# Patient Record
Sex: Female | Born: 1995 | Race: Black or African American | Hispanic: No | Marital: Single | Smoking: Never smoker
Health system: Southern US, Community
[De-identification: ages and names within clinical notes are randomized; demographics above are authoritative.]

---

## 2017-08-08 ENCOUNTER — Emergency Department (HOSPITAL_COMMUNITY): Payer: Self-pay

## 2017-08-08 ENCOUNTER — Emergency Department (HOSPITAL_COMMUNITY)
Admission: EM | Admit: 2017-08-08 | Discharge: 2017-08-09 | Disposition: A | Discharge status: Home / Self Care | Payer: Self-pay | Attending: Emergency Medicine | Admitting: Emergency Medicine

## 2017-08-08 ENCOUNTER — Other Ambulatory Visit: Payer: Self-pay

## 2017-08-08 DIAGNOSIS — R079 Chest pain, unspecified: Secondary | ICD-10-CM | POA: Insufficient documentation

## 2017-08-08 DIAGNOSIS — M546 Pain in thoracic spine: Secondary | ICD-10-CM | POA: Insufficient documentation

## 2017-08-08 DIAGNOSIS — Z5321 Procedure and treatment not carried out due to patient leaving prior to being seen by health care provider: Secondary | ICD-10-CM | POA: Insufficient documentation

## 2017-08-08 LAB — BASIC METABOLIC PANEL
ANION GAP: 9 (ref 5–15)
BUN: 8 mg/dL (ref 6–20)
CO2: 25 mmol/L (ref 22–32)
Calcium: 9.4 mg/dL (ref 8.9–10.3)
Chloride: 107 mmol/L (ref 101–111)
Creatinine, Ser: 0.75 mg/dL (ref 0.44–1.00)
GFR calc non Af Amer: >60 mL/min (ref >60)
GLUCOSE: 88 mg/dL (ref 65–99)
Potassium: 3.6 mmol/L (ref 3.5–5.1)
Sodium: 141 mmol/L (ref 135–145)

## 2017-08-08 LAB — I-STAT BETA HCG BLOOD, ED (MC, WL, AP ONLY): I-stat hCG, quantitative: <5 m[IU]/mL (ref <5)

## 2017-08-08 LAB — I-STAT TROPONIN, ED: TROPONIN I, POC: 0 ng/mL (ref 0.00–0.08)

## 2017-08-08 LAB — CBC
HCT: 41.1 % (ref 36.0–46.0)
HEMOGLOBIN: 13.7 g/dL (ref 12.0–15.0)
MCH: 29.8 pg (ref 26.0–34.0)
MCHC: 33.3 g/dL (ref 30.0–36.0)
MCV: 89.5 fL (ref 78.0–100.0)
Platelets: 228 10*3/uL (ref 150–400)
RBC: 4.59 MIL/uL (ref 3.87–5.11)
RDW: 12.5 % (ref 11.5–15.5)
WBC: 7.4 10*3/uL (ref 4.0–10.5)

## 2017-08-08 NOTE — ED Triage Notes (Signed)
Patient c/o thoracic back pain that has evolved into CP. States that it hurts to take a deep breath.

## 2017-08-09 NOTE — ED Notes (Signed)
Pt turned labels into registration. Pt is leaving and is seen leaving lobby.

## 2019-09-20 IMAGING — CR DG CHEST 2V
2 series · 2 of 2 positions shown · non-contrast
Comparison: None.

CLINICAL DATA: RIGHT-sided posterior chest pain for 1 day.
Nonsmoker.

EXAM:
CHEST - 2 VIEW

[chest pa]
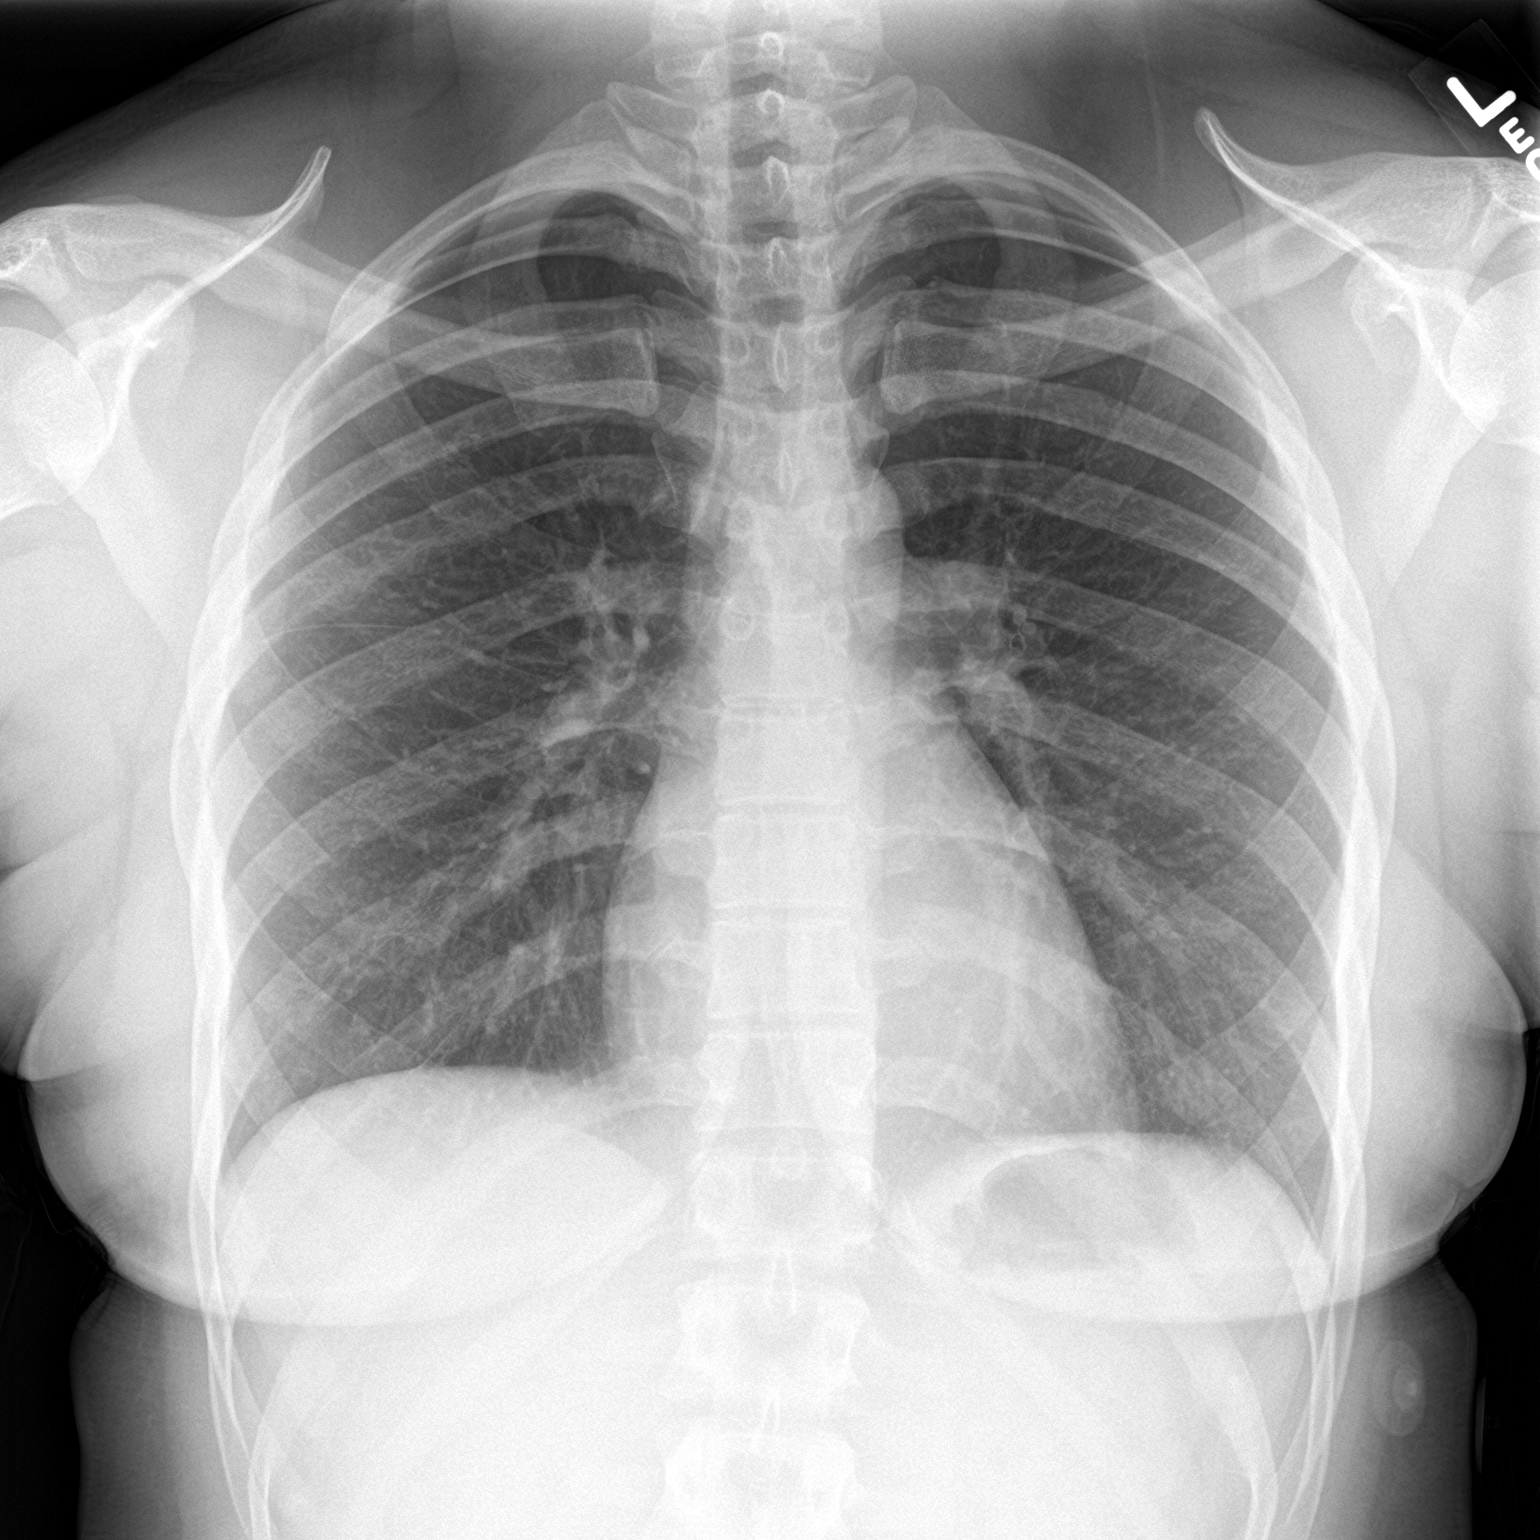

[chest lat]
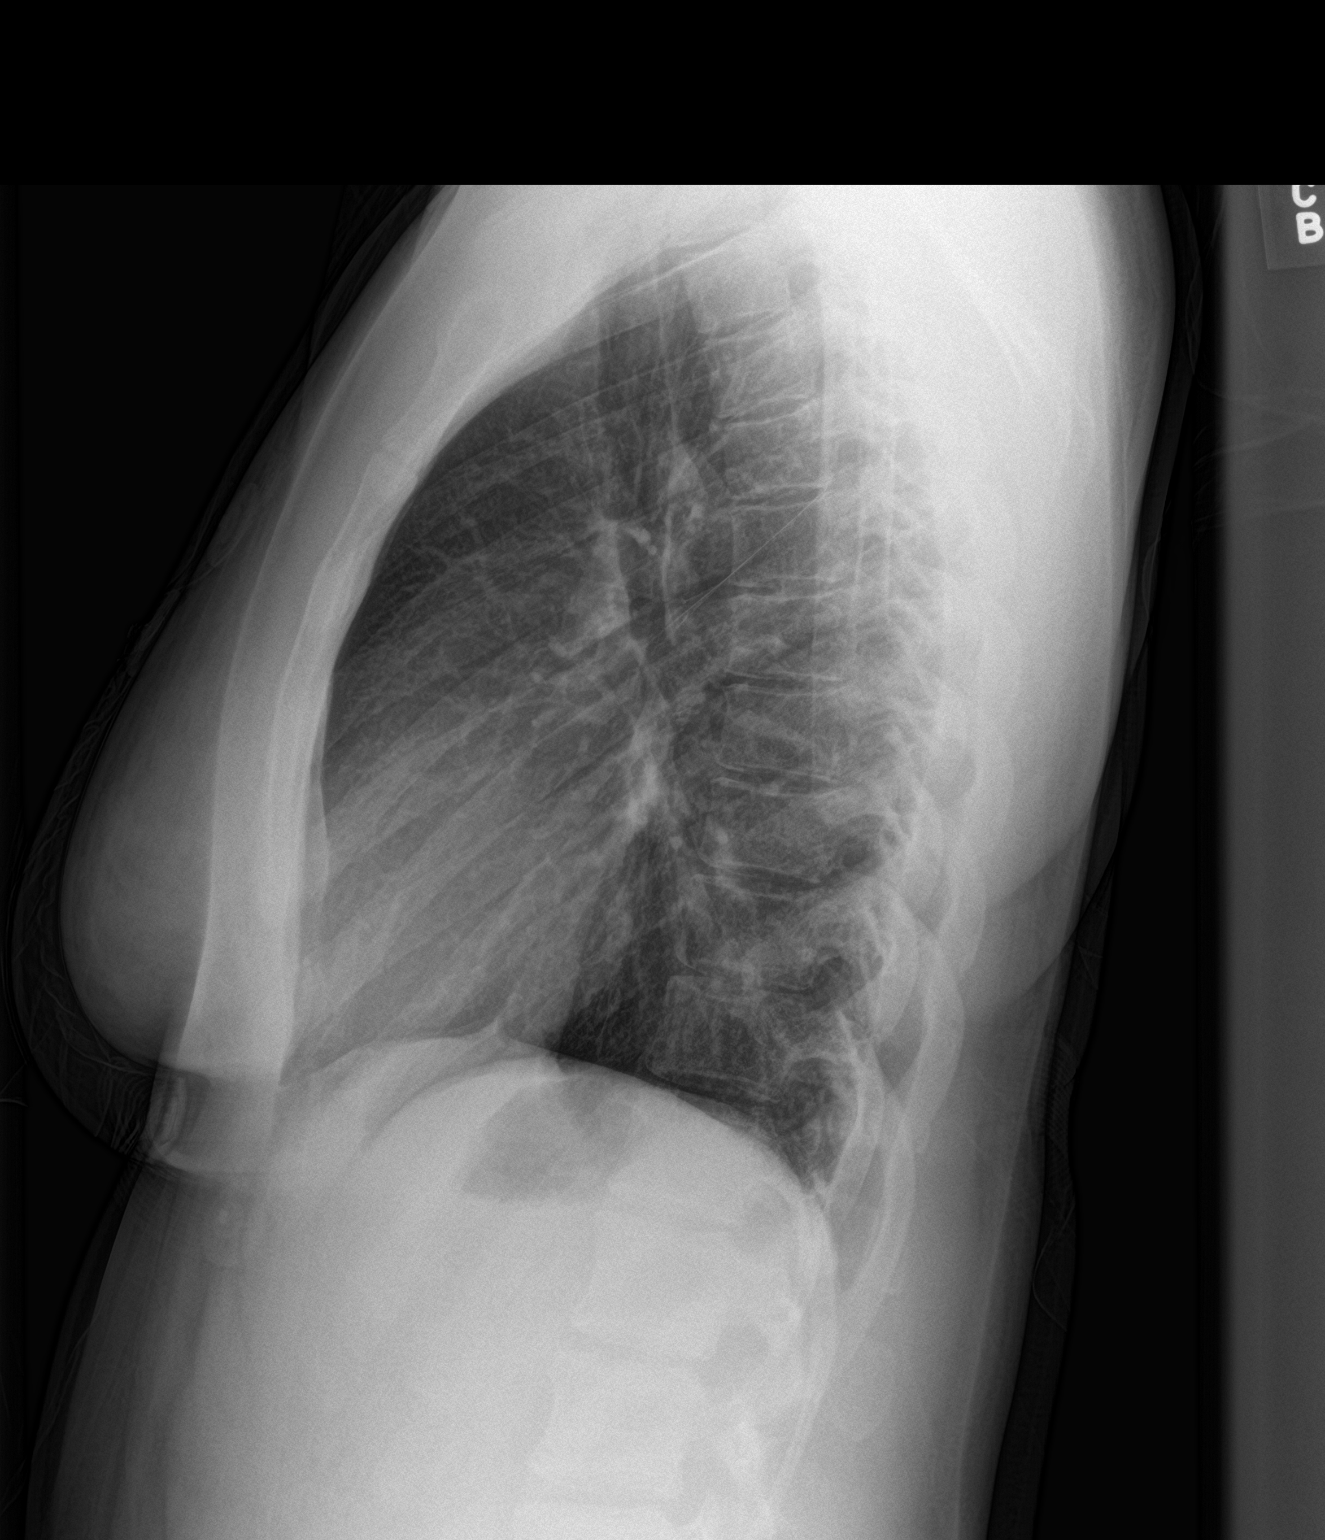

[2 of 2 positions shown; findings below may reference images not displayed]

FINDINGS: The heart size and mediastinal contours are within normal limits.
Both lungs are clear. The visualized skeletal structures are
unremarkable.
IMPRESSION: No active cardiopulmonary disease.

## 2019-11-04 ENCOUNTER — Emergency Department (HOSPITAL_COMMUNITY): Admission: EM | Admit: 2019-11-04 | Discharge: 2019-11-04 | Discharge status: Against Medical Advice | Payer: Self-pay

## 2019-11-04 ENCOUNTER — Other Ambulatory Visit: Payer: Self-pay

## 2019-11-04 NOTE — ED Notes (Signed)
I called patient in the lobby and outside to be triage and no one responded 

## 2019-11-04 NOTE — ED Notes (Signed)
I called patient to take her vitals and no one responded

## 2020-03-23 ENCOUNTER — Ambulatory Visit
Admission: EM | Admit: 2020-03-23 | Discharge: 2020-03-23 | Disposition: A | Discharge status: Home / Self Care | Payer: Medicaid Other | Attending: Emergency Medicine | Admitting: Emergency Medicine

## 2020-03-23 ENCOUNTER — Encounter: Payer: Self-pay | Admitting: *Deleted

## 2020-03-23 ENCOUNTER — Other Ambulatory Visit: Payer: Self-pay

## 2020-03-23 DIAGNOSIS — N898 Other specified noninflammatory disorders of vagina: Secondary | ICD-10-CM | POA: Diagnosis not present

## 2020-03-23 LAB — POCT URINE PREGNANCY: Preg Test, Ur: NEGATIVE

## 2020-03-23 LAB — POCT URINALYSIS DIP (MANUAL ENTRY)
Bilirubin, UA: NEGATIVE
Glucose, UA: NEGATIVE mg/dL
Ketones, POC UA: NEGATIVE mg/dL
Leukocytes, UA: NEGATIVE
Nitrite, UA: NEGATIVE
Protein Ur, POC: NEGATIVE mg/dL
Spec Grav, UA: 1.025 (ref 1.010–1.025)
Urobilinogen, UA: 1 E.U./dL
pH, UA: 6.5 (ref 5.0–8.0)

## 2020-03-23 MED ORDER — NAPROXEN 500 MG PO TABS: 500.0000 mg | ORAL_TABLET | Freq: Two times a day (BID) | ORAL | 0 refills | Status: DC

## 2020-03-23 NOTE — ED Triage Notes (Signed)
C/O vaginal discharge and slight low abd pain without fever x 1 day.

## 2020-03-23 NOTE — ED Provider Notes (Signed)
[name]    CSN: [id number] Arrival date & time: 03/23/20  1308      History   Chief Complaint Chief Complaint  Patient presents with   Vaginal Discharge   Abdominal Pain    HPI Melissa Rowland is a 24 y.o. female presenting today for evaluation of vaginal discharge.  Reports associated discharge and abdominal pain x1 day.  Denies fevers. Reports thicker white discharge. Denies itching or irritation. Reports possible similar to prior chlamydia infection. History of BV but does not feel similar. Reports new partners. LMP 03/03/2020, denies use of birth control.   HPI  History reviewed. No pertinent past medical history.  There are no problems to display for this patient.   History reviewed. No pertinent surgical history.  OB History   No obstetric history on file.      Home Medications    Prior to Admission medications   Medication Sig Start Date End Date Taking? Authorizing Provider  naproxen (NAPROSYN) 500 MG tablet Take 1 tablet (500 mg total) by mouth 2 (two) times daily. 03/23/20   Niveah Boerner, Junius Creamer, PA-C    Family History Family History  Problem Relation Age of Onset   Healthy Mother    Healthy Father     Social History Social History   Tobacco Use   Smoking status: Never Smoker   Smokeless tobacco: Never Used  Building services engineer Use: Never used  Substance Use Topics   Alcohol use: Yes    Comment: socially   Drug use: Never     Allergies   Patient has no known allergies.   Review of Systems Review of Systems  Constitutional: Negative for fever.  Respiratory: Negative for shortness of breath.   Cardiovascular: Negative for chest pain.  Gastrointestinal: Positive for abdominal pain. Negative for diarrhea, nausea and vomiting.  Genitourinary: Positive for vaginal discharge. Negative for dysuria, flank pain, genital sores, hematuria, menstrual problem, vaginal bleeding and vaginal pain.  Musculoskeletal: Negative for  back pain.  Skin: Negative for rash.  Neurological: Negative for dizziness, light-headedness and headaches.     Physical Exam Triage Vital Signs ED Triage Vitals [03/23/20 1326]  Enc Vitals Group     BP 116/79     Pulse Rate 80     Resp 18     Temp 98 F (36.7 C)     Temp Source Oral     SpO2 98 %     Weight      Height      Head Circumference      Peak Flow      Pain Score 0     Pain Loc      Pain Edu?      Excl. in GC?    No data found.  Updated Vital Signs BP 116/79    Pulse 80    Temp 98 F (36.7 C) (Oral)    Resp 18    LMP 03/03/2020 (Approximate)    SpO2 98%   Visual Acuity Right Eye Distance:   Left Eye Distance:   Bilateral Distance:    Right Eye Near:   Left Eye Near:    Bilateral Near:     Physical Exam Vitals and nursing note reviewed.  Constitutional:      Appearance: She is well-developed and well-nourished.     Comments: No acute distress  HENT:     Head: Normocephalic and atraumatic.     Nose: Nose normal.  Eyes:  Conjunctiva/sclera: Conjunctivae normal.  Cardiovascular:     Rate and Rhythm: Normal rate.  Pulmonary:     Effort: Pulmonary effort is normal. No respiratory distress.  Abdominal:     General: There is no distension.     Comments: Soft, nondistended, nontender to light and deep palpation throughout entire abdomen  Musculoskeletal:        General: Normal range of motion.     Cervical back: Neck supple.  Skin:    General: Skin is warm and dry.  Neurological:     Mental Status: She is alert and oriented to person, place, and time.  Psychiatric:        Mood and Affect: Mood and affect normal.      UC Treatments / Results  Labs (all labs ordered are listed, but only abnormal results are displayed) Labs Reviewed  POCT URINALYSIS DIP (MANUAL ENTRY) - Abnormal; Notable for the following components:      Result Value   Blood, UA trace-intact (*)    All other components within normal limits  POCT URINE PREGNANCY   CERVICOVAGINAL ANCILLARY ONLY    EKG   Radiology No results found.  Procedures Procedures (including critical care time)  Medications Ordered in UC Medications - No data to display  Initial Impression / Assessment and Plan / UC Course  I have reviewed the triage vital signs and the nursing notes.  Pertinent labs & imaging results that were available during my care of the patient were reviewed by me and considered in my medical decision making (see chart for details).     Pregnancy test negative, UA with negative leuks and nitrites, vaginal swab pending to screen for STDs as well as yeast and BV.  Deferring empiric treatment until results return.  Naprosyn for abdominal discomfort in the interim.  Discussed strict return precautions. Patient verbalized understanding and is agreeable with plan.  Final Clinical Impressions(s) / UC Diagnoses   Final diagnoses:  Vaginal discharge     Discharge Instructions     Naprosyn for pain  We are testing you for Gonorrhea, Chlamydia, Trichomonas, Yeast and Bacterial Vaginosis. We will call you if anything is positive and let you know if you require any further treatment. Please inform partners of any positive results.   Please return if symptoms not improving with treatment, development of fever, nausea, vomiting, abdominal pain.     ED Prescriptions    Medication Sig Dispense Auth. Provider   naproxen (NAPROSYN) 500 MG tablet Take 1 tablet (500 mg total) by mouth 2 (two) times daily. 30 tablet Shalae Belmonte, Woodman C, PA-C     PDMP not reviewed this encounter.   Lew Dawes, PA-C 03/23/20 1356

## 2020-03-23 NOTE — Discharge Instructions (Signed)
Naprosyn for pain  We are testing you for Gonorrhea, Chlamydia, Trichomonas, Yeast and Bacterial Vaginosis. We will call you if anything is positive and let you know if you require any further treatment. Please inform partners of any positive results.   Please return if symptoms not improving with treatment, development of fever, nausea, vomiting, abdominal pain.

## 2020-03-25 ENCOUNTER — Telehealth (HOSPITAL_COMMUNITY): Payer: Self-pay | Admitting: Emergency Medicine

## 2020-03-25 LAB — CERVICOVAGINAL ANCILLARY ONLY
Bacterial Vaginitis (gardnerella): NEGATIVE
Candida Glabrata: NEGATIVE
Candida Vaginitis: NEGATIVE
Chlamydia: NEGATIVE
Comment: NEGATIVE
Comment: NEGATIVE
Comment: NEGATIVE
Comment: NEGATIVE
Comment: NEGATIVE
Comment: NORMAL
Neisseria Gonorrhea: NEGATIVE
Trichomonas: POSITIVE — AB

## 2020-03-25 MED ORDER — METRONIDAZOLE 500 MG PO TABS
500.0000 mg | ORAL_TABLET | Freq: Two times a day (BID) | ORAL | 0 refills | Status: DC
Start: 2020-03-25 — End: 2020-05-11

## 2020-03-29 ENCOUNTER — Telehealth (HOSPITAL_COMMUNITY): Payer: Self-pay | Admitting: Emergency Medicine

## 2020-03-29 NOTE — Telephone Encounter (Signed)
Patient called for results after letter being sent.   Contacted patient by phone.  Verified identity using two identifiers.  Provided positive result.  Reviewed safe sex practices, notifying partners, and refraining from sexual activities for 7 days from time of treatment.  Patient verified understanding, all questions answered.   Verified pharmacy

## 2020-05-11 ENCOUNTER — Ambulatory Visit
Admission: EM | Admit: 2020-05-11 | Discharge: 2020-05-11 | Disposition: A | Discharge status: Home / Self Care | Payer: Medicaid Other | Attending: Internal Medicine | Admitting: Internal Medicine

## 2020-05-11 ENCOUNTER — Other Ambulatory Visit: Payer: Self-pay

## 2020-05-11 DIAGNOSIS — B373 Candidiasis of vulva and vagina: Secondary | ICD-10-CM | POA: Diagnosis not present

## 2020-05-11 DIAGNOSIS — Z113 Encounter for screening for infections with a predominantly sexual mode of transmission: Secondary | ICD-10-CM | POA: Diagnosis not present

## 2020-05-11 NOTE — ED Triage Notes (Signed)
Pt requesting STD check. Denies vaginal discharge, states has a "off smell".

## 2020-05-11 NOTE — Discharge Instructions (Signed)
We will call you if your results are positive Check on mychart to check on your results.

## 2020-05-11 NOTE — ED Provider Notes (Signed)
[name]    CSN: [id number] Arrival date & time: 05/11/20  1049      History   Chief Complaint Chief Complaint  Patient presents with  . SEXUALLY TRANSMITTED DISEASE    HPI Melissa Rowland is a 25 y.o. female who requests STD testing. Has unprotected sex 2 days ago. She is not on contraception. LMP 1/25. Has noticed vaginal odor and is different than when she had BV. Has been with this partner x 1 year. She questioned her partner if he has been with someone else and he denies it.     History reviewed. No pertinent past medical history.  There are no problems to display for this patient.   History reviewed. No pertinent surgical history.  OB History   No obstetric history on file.      Home Medications    Prior to Admission medications   Not on File    Family History Family History  Problem Relation Age of Onset  . Healthy Mother   . Healthy Father     Social History Social History   Tobacco Use  . Smoking status: Never Smoker  . Smokeless tobacco: Never Used  Vaping Use  . Vaping Use: Never used  Substance Use Topics  . Alcohol use: Yes    Comment: socially  . Drug use: Never     Allergies   Patient has no known allergies.   Review of Systems Review of Systems  Constitutional: Negative for chills, diaphoresis, fatigue, fever and unexpected weight change.  Genitourinary: Positive for vaginal discharge. Negative for dyspareunia, dysuria, flank pain, frequency, genital sores, menstrual problem, pelvic pain, urgency and vaginal pain.  Musculoskeletal: Negative for gait problem and myalgias.  Skin: Negative for rash.  Hematological: Negative for adenopathy.     Physical Exam Triage Vital Signs ED Triage Vitals  Enc Vitals Group     BP 05/11/20 1102 118/67     Pulse Rate 05/11/20 1102 84     Resp 05/11/20 1102 18     Temp 05/11/20 1102 98.4 F (36.9 C)     Temp Source 05/11/20 1102 Oral     SpO2 05/11/20 1102 98 %      Weight --      Height --      Head Circumference --      Peak Flow --      Pain Score 05/11/20 1103 0     Pain Loc --      Pain Edu? --      Excl. in GC? --    No data found.  Updated Vital Signs BP 118/67 (BP Location: Left Arm)   Pulse 84   Temp 98.4 F (36.9 C) (Oral)   Resp 18   LMP 04/27/2020   SpO2 98%   Visual Acuity Right Eye Distance:   Left Eye Distance:   Bilateral Distance:    Right Eye Near:   Left Eye Near:    Bilateral Near:     Physical Exam Vitals and nursing note reviewed.  Constitutional:      General: She is not in acute distress.    Appearance: She is normal weight. She is not toxic-appearing.  HENT:     Right Ear: External ear normal.     Left Ear: External ear normal.  Eyes:     General: No scleral icterus.    Conjunctiva/sclera: Conjunctivae normal.  Pulmonary:     Effort: Pulmonary effort is normal.  Abdominal:  General: Abdomen is flat. Bowel sounds are normal.     Palpations: Abdomen is soft.     Tenderness: There is no abdominal tenderness. There is no guarding.  Musculoskeletal:        General: Normal range of motion.     Cervical back: Neck supple.  Skin:    General: Skin is warm and dry.  Neurological:     Mental Status: She is alert and oriented to person, place, and time.     Gait: Gait normal.  Psychiatric:        Mood and Affect: Mood normal.        Behavior: Behavior normal.        Thought Content: Thought content normal.        Judgment: Judgment normal.      UC Treatments / Results  Labs (all labs ordered are listed, but only abnormal results are displayed) Labs Reviewed - No data to display  EKG   Radiology No results found.  Procedures Procedures (including critical care time)  Medications Ordered in UC Medications - No data to display  Initial Impression / Assessment and Plan / UC Course  I have reviewed the triage vital signs and the nursing notes. STD screen is pending. She wants to wait  for results before being treated  Final Clinical Impressions(s) / UC Diagnoses   Final diagnoses:  None   Discharge Instructions   None    ED Prescriptions    None     PDMP not reviewed this encounter.   Garey Ham, [place] 05/11/20 2238

## 2020-05-12 LAB — CERVICOVAGINAL ANCILLARY ONLY
Bacterial Vaginitis (gardnerella): NEGATIVE
Candida Glabrata: NEGATIVE
Candida Vaginitis: NEGATIVE
Chlamydia: POSITIVE — AB
Comment: NEGATIVE
Comment: NEGATIVE
Comment: NEGATIVE
Comment: NEGATIVE
Comment: NEGATIVE
Comment: NORMAL
Neisseria Gonorrhea: NEGATIVE
Trichomonas: NEGATIVE

## 2020-05-13 ENCOUNTER — Telehealth (HOSPITAL_COMMUNITY): Payer: Self-pay | Admitting: Emergency Medicine

## 2020-05-13 MED ORDER — DOXYCYCLINE HYCLATE 100 MG PO CAPS: 100.0000 mg | ORAL_CAPSULE | Freq: Two times a day (BID) | ORAL | 0 refills | Status: AC

## 2020-05-24 ENCOUNTER — Other Ambulatory Visit: Payer: Self-pay

## 2020-05-24 ENCOUNTER — Ambulatory Visit
Admission: EM | Admit: 2020-05-24 | Discharge: 2020-05-24 | Disposition: A | Discharge status: Home / Self Care | Payer: Medicaid Other | Attending: Physician Assistant | Admitting: Physician Assistant

## 2020-05-24 DIAGNOSIS — Z113 Encounter for screening for infections with a predominantly sexual mode of transmission: Secondary | ICD-10-CM

## 2020-05-24 DIAGNOSIS — Z202 Contact with and (suspected) exposure to infections with a predominantly sexual mode of transmission: Secondary | ICD-10-CM | POA: Diagnosis not present

## 2020-05-24 DIAGNOSIS — N390 Urinary tract infection, site not specified: Secondary | ICD-10-CM | POA: Insufficient documentation

## 2020-05-24 DIAGNOSIS — R300 Dysuria: Secondary | ICD-10-CM | POA: Diagnosis present

## 2020-05-24 DIAGNOSIS — R350 Frequency of micturition: Secondary | ICD-10-CM

## 2020-05-24 LAB — POCT URINALYSIS DIP (MANUAL ENTRY)
Bilirubin, UA: NEGATIVE
Glucose, UA: NEGATIVE mg/dL
Ketones, POC UA: NEGATIVE mg/dL
Leukocytes, UA: NEGATIVE
Nitrite, UA: NEGATIVE
Protein Ur, POC: NEGATIVE mg/dL
Spec Grav, UA: >=1.03 — AB (ref 1.010–1.025)
Urobilinogen, UA: 0.2 E.U./dL
pH, UA: 5.5 (ref 5.0–8.0)

## 2020-05-24 LAB — POCT URINE PREGNANCY: Preg Test, Ur: NEGATIVE

## 2020-05-24 NOTE — ED Triage Notes (Addendum)
Pt c/o lower abdominal pain on urination with some burning and frequency since Saturday. Denies vaginal discomfort or discharge. Pt requesting STD testing.

## 2020-05-24 NOTE — ED Provider Notes (Addendum)
[name]    CSN: [id number] Arrival date & time: 05/24/20  1653      History   Chief Complaint Chief Complaint  Patient presents with  . Urinary Tract Infection    HPI Melissa Rowland is a 25 y.o. female.   Pt complains of increased urinary frequency and lower abdominal comfort that started several days ago.  She reports mild dysuria.  She is currently on her menstrual cycle.  Reports recent new partner, STD testing requested.  Denies vaginal pain, irritation, itching, or discharge.       History reviewed. No pertinent past medical history.  There are no problems to display for this patient.   History reviewed. No pertinent surgical history.  OB History   No obstetric history on file.      Home Medications    Prior to Admission medications   Not on File    Family History Family History  Problem Relation Age of Onset  . Healthy Mother   . Healthy Father     Social History Social History   Tobacco Use  . Smoking status: Never Smoker  . Smokeless tobacco: Never Used  Vaping Use  . Vaping Use: Never used  Substance Use Topics  . Alcohol use: Yes    Comment: socially  . Drug use: Never     Allergies   Patient has no known allergies.   Review of Systems Review of Systems  Constitutional: Negative for chills and fever.  HENT: Negative for ear pain and sore throat.   Eyes: Negative for pain and visual disturbance.  Respiratory: Negative for cough and shortness of breath.   Cardiovascular: Negative for chest pain and palpitations.  Gastrointestinal: Positive for abdominal pain. Negative for vomiting.  Genitourinary: Positive for dysuria and frequency. Negative for difficulty urinating, hematuria, pelvic pain, vaginal discharge and vaginal pain.  Musculoskeletal: Negative for arthralgias and back pain.  Skin: Negative for color change and rash.  Neurological: Negative for seizures and syncope.  All other systems reviewed and are  negative.    Physical Exam Triage Vital Signs ED Triage Vitals [05/24/20 1822]  Enc Vitals Group     BP 105/74     Pulse Rate (!) 103     Resp 18     Temp 98.6 F (37 C)     Temp Source Oral     SpO2 98 %     Weight      Height      Head Circumference      Peak Flow      Pain Score      Pain Loc      Pain Edu?      Excl. in GC?    No data found.  Updated Vital Signs BP 105/74 (BP Location: Left Arm)   Pulse (!) 103   Temp 98.6 F (37 C) (Oral)   Resp 18   LMP 04/25/2020   SpO2 98%   Visual Acuity Right Eye Distance:   Left Eye Distance:   Bilateral Distance:    Right Eye Near:   Left Eye Near:    Bilateral Near:     Physical Exam Vitals and nursing note reviewed.  Constitutional:      General: She is not in acute distress.    Appearance: She is well-developed and well-nourished.  HENT:     Head: Normocephalic and atraumatic.  Eyes:     Conjunctiva/sclera: Conjunctivae normal.  Cardiovascular:     Rate and Rhythm: Normal  rate and regular rhythm.     Heart sounds: No murmur heard.   Pulmonary:     Effort: Pulmonary effort is normal. No respiratory distress.     Breath sounds: Normal breath sounds.  Abdominal:     Palpations: Abdomen is soft.     Tenderness: There is no abdominal tenderness.  Musculoskeletal:        General: No edema.     Cervical back: Neck supple.  Skin:    General: Skin is warm and dry.  Neurological:     Mental Status: She is alert.  Psychiatric:        Mood and Affect: Mood and affect normal.      UC Treatments / Results  Labs (all labs ordered are listed, but only abnormal results are displayed) Labs Reviewed  POCT URINE PREGNANCY  POCT URINALYSIS DIP (MANUAL ENTRY)  CERVICOVAGINAL ANCILLARY ONLY    EKG   Radiology No results found.  Procedures Procedures (including critical care time)  Medications Ordered in UC Medications - No data to display  Initial Impression / Assessment and Plan / UC Course   I have reviewed the triage vital signs and the nursing notes.  Pertinent labs & imaging results that were available during my care of the patient were reviewed by me and considered in my medical decision making (see chart for details).        UA with no signs of infection.  Labs pending, will treat if needed based on results.  Final Clinical Impressions(s) / UC Diagnoses   Final diagnoses:  None   Discharge Instructions   None    ED Prescriptions    None     PDMP not reviewed this encounter.   Jodell Cipro, PA-C 05/24/20 1952    Jodell Cipro, PA-C 08/11/20 1600

## 2020-05-25 LAB — CERVICOVAGINAL ANCILLARY ONLY
Bacterial Vaginitis (gardnerella): NEGATIVE
Candida Glabrata: NEGATIVE
Candida Vaginitis: NEGATIVE
Chlamydia: NEGATIVE
Comment: NEGATIVE
Comment: NEGATIVE
Comment: NEGATIVE
Comment: NEGATIVE
Comment: NEGATIVE
Comment: NORMAL
Neisseria Gonorrhea: NEGATIVE
Trichomonas: NEGATIVE

## 2020-05-26 LAB — URINE CULTURE: Culture: 4000 — AB

## 2020-06-28 ENCOUNTER — Ambulatory Visit
Admission: EM | Admit: 2020-06-28 | Discharge: 2020-06-28 | Disposition: A | Discharge status: Home / Self Care | Payer: Medicaid Other | Attending: Emergency Medicine | Admitting: Emergency Medicine

## 2020-06-28 ENCOUNTER — Other Ambulatory Visit: Payer: Self-pay

## 2020-06-28 ENCOUNTER — Encounter: Payer: Self-pay | Admitting: Emergency Medicine

## 2020-06-28 DIAGNOSIS — N898 Other specified noninflammatory disorders of vagina: Secondary | ICD-10-CM | POA: Insufficient documentation

## 2020-06-28 DIAGNOSIS — Z113 Encounter for screening for infections with a predominantly sexual mode of transmission: Secondary | ICD-10-CM | POA: Insufficient documentation

## 2020-06-28 MED ORDER — METRONIDAZOLE 500 MG PO TABS: 500.0000 mg | ORAL_TABLET | Freq: Two times a day (BID) | ORAL | 0 refills | Status: AC

## 2020-06-28 NOTE — ED Triage Notes (Signed)
Patient complains of fishy odor that she noticed Saturday.  Patient has a vaginal discharge.  Patient thinks this is BV

## 2020-06-28 NOTE — ED Provider Notes (Signed)
[name]    CSN: [id number] Arrival date & time: 06/28/20  1857      History   Chief Complaint No chief complaint on file.   HPI Melissa Rowland Melissa Rowland is a 25 y.o. female.   Melissa Rowland presents with complaints of vaginal discharge with odor which started two days ago. Just off her period, no other vaginal bleeding or irregular periods. No pelvic pain. No urinary symptoms. No vaginal itching. Similar to BV she has had in the past. Sexually active with 1 partner. No specific known std exposure but interested in screening.    ROS per HPI, negative if not otherwise mentioned.      History reviewed. No pertinent past medical history.  There are no problems to display for this patient.   History reviewed. No pertinent surgical history.  OB History   No obstetric history on file.      Home Medications    Prior to Admission medications   Medication Sig Start Date End Date Taking? Authorizing Provider  metroNIDAZOLE (FLAGYL) 500 MG tablet Take 1 tablet (500 mg total) by mouth 2 (two) times daily for 7 days. 06/28/20 07/05/20 Yes Georgetta Haber, NP    Family History Family History  Problem Relation Age of Onset  . Healthy Mother   . Healthy Father     Social History Social History   Tobacco Use  . Smoking status: Never Smoker  . Smokeless tobacco: Never Used  Vaping Use  . Vaping Use: Never used  Substance Use Topics  . Alcohol use: Yes    Comment: socially  . Drug use: Never     Allergies   Patient has no known allergies.   Review of Systems Review of Systems   Physical Exam Triage Vital Signs ED Triage Vitals  Enc Vitals Group     BP 06/28/20 1956 (!) 130/91     Pulse Rate 06/28/20 1956 96     Resp 06/28/20 1956 16     Temp 06/28/20 1956 98.2 F (36.8 C)     Temp Source 06/28/20 1956 Oral     SpO2 06/28/20 1956 98 %     Weight --      Height --      Head Circumference --      Peak Flow --      Pain Score 06/28/20 2002 0      Pain Loc --      Pain Edu? --      Excl. in GC? --    No data found.  Updated Vital Signs BP (!) 130/91 (BP Location: Left Arm)   Pulse 96   Temp 98.2 F (36.8 C) (Oral)   Resp 16   LMP 06/21/2020   SpO2 98%   Visual Acuity Right Eye Distance:   Left Eye Distance:   Bilateral Distance:    Right Eye Near:   Left Eye Near:    Bilateral Near:     Physical Exam Constitutional:      General: She is not in acute distress.    Appearance: She is well-developed.  Cardiovascular:     Rate and Rhythm: Normal rate.  Pulmonary:     Effort: Pulmonary effort is normal.  Abdominal:     Palpations: Abdomen is not rigid.     Tenderness: There is no abdominal tenderness. There is no guarding or rebound.  Genitourinary:    Comments: Denies sores, lesions, vaginal bleeding; no pelvic pain; gu exam deferred at this time, vaginal  self swab collected.   Skin:    General: Skin is warm and dry.  Neurological:     Mental Status: She is alert and oriented to person, place, and time.      UC Treatments / Results  Labs (all labs ordered are listed, but only abnormal results are displayed) Labs Reviewed  HIV ANTIBODY (ROUTINE TESTING W REFLEX)  RPR  CERVICOVAGINAL ANCILLARY ONLY    EKG   Radiology No results found.  Procedures Procedures (including critical care time)  Medications Ordered in UC Medications - No data to display  Initial Impression / Assessment and Plan / UC Course  I have reviewed the triage vital signs and the nursing notes.  Pertinent labs & imaging results that were available during my care of the patient were reviewed by me and considered in my medical decision making (see chart for details).     Flagyl initiated pending vaginal cytology.  Patient verbalized understanding and agreeable to plan.   Final Clinical Impressions(s) / UC Diagnoses   Final diagnoses:  Screen for STD (sexually transmitted disease)  Vaginal discharge     Discharge  Instructions     I have started treatment for bacterial vaginosis. Pending the results of your vaginal swab.  We will notify of you any positive findings or if any changes to treatment are needed. If normal or otherwise without concern to your results, we will not call you. Please log on to your MyChart to review your results if interested in so.      ED Prescriptions    Medication Sig Dispense Auth. Provider   metroNIDAZOLE (FLAGYL) 500 MG tablet Take 1 tablet (500 mg total) by mouth 2 (two) times daily for 7 days. 14 tablet Georgetta Haber, NP     PDMP not reviewed this encounter.   Georgetta Haber, NP 06/28/20 2055

## 2020-06-28 NOTE — Discharge Instructions (Addendum)
I have started treatment for bacterial vaginosis. Pending the results of your vaginal swab.  We will notify of you any positive findings or if any changes to treatment are needed. If normal or otherwise without concern to your results, we will not call you. Please log on to your MyChart to review your results if interested in so.

## 2020-06-30 LAB — CERVICOVAGINAL ANCILLARY ONLY
Bacterial Vaginitis (gardnerella): POSITIVE — AB
Candida Glabrata: NEGATIVE
Candida Vaginitis: NEGATIVE
Chlamydia: NEGATIVE
Comment: NEGATIVE
Comment: NEGATIVE
Comment: NEGATIVE
Comment: NEGATIVE
Comment: NEGATIVE
Comment: NORMAL
Neisseria Gonorrhea: NEGATIVE
Trichomonas: NEGATIVE

## 2020-06-30 LAB — HIV ANTIBODY (ROUTINE TESTING W REFLEX): HIV Screen 4th Generation wRfx: NONREACTIVE

## 2020-06-30 LAB — RPR: RPR Ser Ql: NONREACTIVE

## 2020-07-19 ENCOUNTER — Ambulatory Visit
Admission: EM | Admit: 2020-07-19 | Discharge: 2020-07-19 | Disposition: A | Discharge status: Home / Self Care | Payer: Medicaid Other | Attending: Family Medicine | Admitting: Family Medicine

## 2020-07-19 ENCOUNTER — Other Ambulatory Visit: Payer: Self-pay

## 2020-07-19 DIAGNOSIS — N898 Other specified noninflammatory disorders of vagina: Secondary | ICD-10-CM | POA: Diagnosis not present

## 2020-07-19 DIAGNOSIS — N949 Unspecified condition associated with female genital organs and menstrual cycle: Secondary | ICD-10-CM | POA: Diagnosis present

## 2020-07-19 DIAGNOSIS — Z711 Person with feared health complaint in whom no diagnosis is made: Secondary | ICD-10-CM | POA: Insufficient documentation

## 2020-07-19 LAB — POCT URINALYSIS DIP (MANUAL ENTRY)
Bilirubin, UA: NEGATIVE
Glucose, UA: NEGATIVE mg/dL
Ketones, POC UA: NEGATIVE mg/dL
Leukocytes, UA: NEGATIVE
Nitrite, UA: NEGATIVE
Protein Ur, POC: NEGATIVE mg/dL
Spec Grav, UA: >=1.03 — AB (ref 1.010–1.025)
Urobilinogen, UA: 0.2 E.U./dL
pH, UA: 5.5 (ref 5.0–8.0)

## 2020-07-19 LAB — POCT URINE PREGNANCY: Preg Test, Ur: NEGATIVE

## 2020-07-19 NOTE — ED Triage Notes (Signed)
Pt presents with c/o vaginal pain that is intermittent and non odorous discharge that began a few days ago, also has c/o sore throat that began today .

## 2020-07-19 NOTE — ED Provider Notes (Signed)
[name]    CSN: [id number] Arrival date & time: 07/19/20  3382      History   Chief Complaint Chief Complaint  Patient presents with  . Exposure to STD    HPI Melissa Rowland is a 25 y.o. female.   HPI Patient presents today with a complaint of vaginal discomfort and discharge which is malodorous. Patient was seen less than a month ago and screened for STDs which cytology revealed bacterial vaginosis. Patient is without primary care and also without PCP here in [place]. History reviewed. No pertinent past medical history.  There are no problems to display for this patient.   History reviewed. No pertinent surgical history.  OB History   No obstetric history on file.      Home Medications    Prior to Admission medications   Not on File    Family History Family History  Problem Relation Age of Onset  . Healthy Mother   . Healthy Father     Social History Social History   Tobacco Use  . Smoking status: Never Smoker  . Smokeless tobacco: Never Used  Vaping Use  . Vaping Use: Never used  Substance Use Topics  . Alcohol use: Yes    Comment: socially  . Drug use: Never     Allergies   Patient has no known allergies.   Review of Systems Review of Systems Pertinent negatives listed in HPI   Physical Exam Triage Vital Signs ED Triage Vitals [07/19/20 0834]  Enc Vitals Group     BP      Pulse      Resp      Temp      Temp src      SpO2      Weight      Height      Head Circumference      Peak Flow      Pain Score 5     Pain Loc      Pain Edu?      Excl. in GC?    No data found.  Updated Vital Signs BP 118/82   Pulse 98   Temp 98.2 F (36.8 C)   Resp 18   LMP 06/21/2020   SpO2 99%   Visual Acuity Right Eye Distance:   Left Eye Distance:   Bilateral Distance:    Right Eye Near:   Left Eye Near:    Bilateral Near:     Physical Exam General appearance: alert, well developed, well nourished,  cooperative Head: Normocephalic, without obvious abnormality, atraumatic Respiratory: Respirations even and unlabored, normal respiratory rate Heart: Rate and rhythm normal. No gallop or murmurs noted on exam  Extremities: No gross deformities Skin: Skin color, texture, turgor normal. No rashes seen  Psych: Appropriate mood and affect. Neurologic: GCS 15, normal coordination normal gait Vaginal cytology self collected   UC Treatments / Results  Labs (all labs ordered are listed, but only abnormal results are displayed) Labs Reviewed  POCT URINALYSIS DIP (MANUAL ENTRY) - Abnormal; Notable for the following components:      Result Value   Spec Grav, UA >=1.030 (*)    Blood, UA small (*)    All other components within normal limits  POCT URINE PREGNANCY  CERVICOVAGINAL ANCILLARY ONLY    EKG   Radiology No results found.  Procedures Procedures (including critical care time)  Medications Ordered in UC Medications - No data to display  Initial Impression / Assessment and Plan /  UC Course  I have reviewed the triage vital signs and the nursing notes.  Pertinent labs & imaging results that were available during my care of the patient were reviewed by me and considered in my medical decision making (see chart for details).     Cytology pending.  Patient advised that she needs to follow-up with gynecology given intermittent vaginal discomfort as she is overdue for a Pap smear and a full gynecological exam.  Advised that if any STDs are present on cytology we will treat.  Deferred any treatment today as patient's not having any specific vaginitis type symptoms. Final Clinical Impressions(s) / UC Diagnoses   Final diagnoses:  Vaginal discharge  Vaginal discomfort  Concern about STD in female without diagnosis   Discharge Instructions   None    ED Prescriptions    None     PDMP not reviewed this encounter.   Bing Neighbors, [place] 07/19/20 [phone]

## 2020-07-20 LAB — CERVICOVAGINAL ANCILLARY ONLY
Bacterial Vaginitis (gardnerella): NEGATIVE
Candida Glabrata: NEGATIVE
Candida Vaginitis: NEGATIVE
Chlamydia: NEGATIVE
Comment: NEGATIVE
Comment: NEGATIVE
Comment: NEGATIVE
Comment: NEGATIVE
Comment: NEGATIVE
Comment: NORMAL
Neisseria Gonorrhea: NEGATIVE
Trichomonas: NEGATIVE

## 2020-07-27 ENCOUNTER — Ambulatory Visit
Admission: EM | Admit: 2020-07-27 | Discharge: 2020-07-27 | Disposition: A | Discharge status: Home / Self Care | Payer: Medicaid Other | Attending: Emergency Medicine | Admitting: Emergency Medicine

## 2020-07-27 ENCOUNTER — Other Ambulatory Visit: Payer: Self-pay

## 2020-07-27 DIAGNOSIS — N760 Acute vaginitis: Secondary | ICD-10-CM | POA: Diagnosis not present

## 2020-07-27 DIAGNOSIS — N898 Other specified noninflammatory disorders of vagina: Secondary | ICD-10-CM

## 2020-07-27 DIAGNOSIS — Z113 Encounter for screening for infections with a predominantly sexual mode of transmission: Secondary | ICD-10-CM | POA: Insufficient documentation

## 2020-07-27 LAB — POCT URINE PREGNANCY: Preg Test, Ur: NEGATIVE

## 2020-07-27 MED ORDER — METRONIDAZOLE 500 MG PO TABS: 500.0000 mg | ORAL_TABLET | Freq: Two times a day (BID) | ORAL | 0 refills | Status: DC

## 2020-07-27 NOTE — ED Provider Notes (Signed)
[name]    CSN: [id number] Arrival date & time: 07/27/20  0816      History   Chief Complaint Chief Complaint  Patient presents with  . SEXUALLY TRANSMITTED DISEASE    HPI Melissa Rowland is a 25 y.o. female presenting today for evaluation of STD screening.  Was seen here approximately 1 week ago with negative vaginal swab.  Previously she has tested positive for bacterial vaginosis as well as chlamydia and trichomonas on prior swabs from December and February.  Recently has had increased discharge with fishy odor, similar to prior BV infections.  HPI  History reviewed. No pertinent past medical history.  There are no problems to display for this patient.   History reviewed. No pertinent surgical history.  OB History   No obstetric history on file.      Home Medications    Prior to Admission medications   Medication Sig Start Date End Date Taking? Authorizing Provider  metroNIDAZOLE (FLAGYL) 500 MG tablet Take 1 tablet (500 mg total) by mouth 2 (two) times daily. 07/27/20  Yes Blythe Hartshorn, Junius Creamer, PA-C    Family History Family History  Problem Relation Age of Onset  . Healthy Mother   . Healthy Father     Social History Social History   Tobacco Use  . Smoking status: Never Smoker  . Smokeless tobacco: Never Used  Vaping Use  . Vaping Use: Never used  Substance Use Topics  . Alcohol use: Yes    Comment: socially  . Drug use: Never     Allergies   Patient has no known allergies.   Review of Systems Review of Systems  Constitutional: Negative for fever.  Respiratory: Negative for shortness of breath.   Cardiovascular: Negative for chest pain.  Gastrointestinal: Negative for abdominal pain, diarrhea, nausea and vomiting.  Genitourinary: Positive for vaginal discharge. Negative for dysuria, flank pain, genital sores, hematuria, menstrual problem, vaginal bleeding and vaginal pain.  Musculoskeletal: Negative for back pain.  Skin:  Negative for rash.  Neurological: Negative for dizziness, light-headedness and headaches.     Physical Exam Triage Vital Signs ED Triage Vitals  Enc Vitals Group     BP      Pulse      Resp      Temp      Temp src      SpO2      Weight      Height      Head Circumference      Peak Flow      Pain Score      Pain Loc      Pain Edu?      Excl. in GC?    No data found.  Updated Vital Signs BP 117/84 (BP Location: Left Arm)   Pulse 75   Temp 98.3 F (36.8 C) (Oral)   Resp 16   LMP 06/21/2020   SpO2 98%   Visual Acuity Right Eye Distance:   Left Eye Distance:   Bilateral Distance:    Right Eye Near:   Left Eye Near:    Bilateral Near:     Physical Exam Vitals and nursing note reviewed.  Constitutional:      Appearance: She is well-developed.     Comments: No acute distress  HENT:     Head: Normocephalic and atraumatic.     Nose: Nose normal.  Eyes:     Conjunctiva/sclera: Conjunctivae normal.  Cardiovascular:     Rate and Rhythm: Normal rate.  Pulmonary:     Effort: Pulmonary effort is normal. No respiratory distress.  Abdominal:     General: There is no distension.  Musculoskeletal:        General: Normal range of motion.     Cervical back: Neck supple.  Skin:    General: Skin is warm and dry.  Neurological:     Mental Status: She is alert and oriented to person, place, and time.      UC Treatments / Results  Labs (all labs ordered are listed, but only abnormal results are displayed) Labs Reviewed  POCT URINE PREGNANCY  CERVICOVAGINAL ANCILLARY ONLY    EKG   Radiology No results found.  Procedures Procedures (including critical care time)  Medications Ordered in UC Medications - No data to display  Initial Impression / Assessment and Plan / UC Course  I have reviewed the triage vital signs and the nursing notes.  Pertinent labs & imaging results that were available during my care of the patient were reviewed by me and considered  in my medical decision making (see chart for details).     Pregnancy test negative, repeat vaginal swab pending.  Empirically treating for BV with Flagyl x1 week.  Will call with results of swab and alter therapy as needed.  Discussed strict return precautions. Patient verbalized understanding and is agreeable with plan.  Final Clinical Impressions(s) / UC Diagnoses   Final diagnoses:  Vaginitis and vulvovaginitis  Vaginal discharge     Discharge Instructions     Pregnancy test negative Begin metronidazole twice daily for 1 week to treat BV, no alcohol until 24 hours after last tablet Vaginal swab pending to further screen for causes of discharge Follow-up if not improving or worsening    ED Prescriptions    Medication Sig Dispense Auth. Provider   metroNIDAZOLE (FLAGYL) 500 MG tablet Take 1 tablet (500 mg total) by mouth 2 (two) times daily. 14 tablet Kaena Santori, [place] C, PA-C     PDMP not reviewed this encounter.   Johanny Segers, Dixon C, PA-C 07/27/20 1120

## 2020-07-27 NOTE — ED Triage Notes (Signed)
Pt states had neg STD testing on 4/18. States now having a white milky vaginal discharge with a fishy odor.

## 2020-07-27 NOTE — Discharge Instructions (Addendum)
Pregnancy test negative Begin metronidazole twice daily for 1 week to treat BV, no alcohol until 24 hours after last tablet Vaginal swab pending to further screen for causes of discharge Follow-up if not improving or worsening

## 2020-07-28 LAB — CERVICOVAGINAL ANCILLARY ONLY
Bacterial Vaginitis (gardnerella): POSITIVE — AB
Candida Glabrata: NEGATIVE
Candida Vaginitis: NEGATIVE
Chlamydia: NEGATIVE
Comment: NEGATIVE
Comment: NEGATIVE
Comment: NEGATIVE
Comment: NEGATIVE
Comment: NEGATIVE
Comment: NORMAL
Neisseria Gonorrhea: NEGATIVE
Trichomonas: NEGATIVE

## 2020-08-04 ENCOUNTER — Other Ambulatory Visit: Payer: Self-pay

## 2020-08-04 ENCOUNTER — Ambulatory Visit
Admission: EM | Admit: 2020-08-04 | Discharge: 2020-08-04 | Disposition: A | Discharge status: Home / Self Care | Payer: Medicaid Other | Attending: Family Medicine | Admitting: Family Medicine

## 2020-08-04 DIAGNOSIS — N898 Other specified noninflammatory disorders of vagina: Secondary | ICD-10-CM | POA: Insufficient documentation

## 2020-08-04 LAB — POCT URINALYSIS DIP (MANUAL ENTRY)
Bilirubin, UA: NEGATIVE
Blood, UA: NEGATIVE
Glucose, UA: NEGATIVE mg/dL
Ketones, POC UA: NEGATIVE mg/dL
Nitrite, UA: NEGATIVE
Protein Ur, POC: NEGATIVE mg/dL
Spec Grav, UA: 1.025 (ref 1.010–1.025)
Urobilinogen, UA: 0.2 E.U./dL
pH, UA: 5.5 (ref 5.0–8.0)

## 2020-08-04 LAB — POCT URINE PREGNANCY: Preg Test, Ur: NEGATIVE

## 2020-08-04 NOTE — ED Triage Notes (Signed)
Patient presents to Urgent Care for follow-up appt. She states she was treated for BV and prescribed an antibiotic. She reports the antibiotic has not provided relief.  Pt is also concerned that she has not had her menstrual cycle yet. She reports a hx of irregular periods but the last 3 months have been regular.    Denies abdominal pain or fever.

## 2020-08-04 NOTE — Discharge Instructions (Addendum)
May try probiotics, boric acid suppositories that you can buy over-the-counter while awaiting results to see if this will help reduce your vaginal symptoms

## 2020-08-04 NOTE — ED Provider Notes (Signed)
[name]    CSN: [id number] Arrival date & time: 08/04/20  0813      History   Chief Complaint Chief Complaint  Patient presents with  . SEXUALLY TRANSMITTED DISEASE    HPI Melissa Rowland is a 25 y.o. female.   Patient presenting today with ongoing issues with vaginal discharge.  Was treated last week with Flagyl for bacterial vaginosis and states that this improved the odor she was experiencing but still having copious amounts of discharge.  Denies vaginal itching, irritation, rashes, pelvic pain, abdominal pain, nausea vomiting diarrhea, new sexual partners.  Not trying anything over-the-counter at this time for symptoms.  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     History reviewed. No pertinent past medical history.  There are no problems to display for this patient.   History reviewed. No pertinent surgical history.  OB History   No obstetric history on file.      Home Medications    Prior to Admission medications   Medication Sig Start Date End Date Taking? Authorizing Provider  metroNIDAZOLE (FLAGYL) 500 MG tablet Take 1 tablet (500 mg total) by mouth 2 (two) times daily. 07/27/20   Wieters, Junius Creamer, PA-C    Family History Family History  Problem Relation Age of Onset  . Healthy Mother   . Healthy Father     Social History Social History   Tobacco Use  . Smoking status: Never Smoker  . Smokeless tobacco: Never Used  Vaping Use  . Vaping Use: Never used  Substance Use Topics  . Alcohol use: Yes    Comment: socially  . Drug use: Never     Allergies   Patient has no known allergies.   Review of Systems Review of Systems Per HPI Physical Exam Triage Vital Signs ED Triage Vitals  Enc Vitals Group     BP 08/04/20 0824 118/80     Pulse Rate 08/04/20 0824 85     Resp --      Temp 08/04/20 0824 98 F (36.7 C)     Temp Source 08/04/20 0824 Oral     SpO2 08/04/20 0824 97 %     Weight --      Height --       Head Circumference --      Peak Flow --      Pain Score 08/04/20 0822 0     Pain Loc --      Pain Edu? --      Excl. in GC? --    No data found.  Updated Vital Signs BP 118/80 (BP Location: Left Arm)   Pulse 85   Temp 98 F (36.7 C) (Oral)   LMP 07/22/2020   SpO2 97%   Visual Acuity Right Eye Distance:   Left Eye Distance:   Bilateral Distance:    Right Eye Near:   Left Eye Near:    Bilateral Near:     Physical Exam Vitals and nursing note reviewed.  Constitutional:      Appearance: Normal appearance. She is not ill-appearing.  HENT:     Head: Atraumatic.  Eyes:     Extraocular Movements: Extraocular movements intact.     Conjunctiva/sclera: Conjunctivae normal.  Cardiovascular:     Rate and Rhythm: Normal rate and regular rhythm.     Heart sounds: Normal heart sounds.  Pulmonary:     Effort: Pulmonary effort is normal. No respiratory distress.     Breath sounds: Normal breath  sounds. No wheezing or rales.  Abdominal:     General: Bowel sounds are normal. There is no distension.     Palpations: Abdomen is soft.     Tenderness: There is no abdominal tenderness. There is no right CVA tenderness, left CVA tenderness or guarding.  Genitourinary:    Comments: GU exam deferred, self swab performed Musculoskeletal:        General: Normal range of motion.     Cervical back: Normal range of motion and neck supple.  Skin:    General: Skin is warm and dry.  Neurological:     Mental Status: She is alert and oriented to person, place, and time.  Psychiatric:        Mood and Affect: Mood normal.        Thought Content: Thought content normal.        Judgment: Judgment normal.      UC Treatments / Results  Labs (all labs ordered are listed, but only abnormal results are displayed) Labs Reviewed  POCT URINALYSIS DIP (MANUAL ENTRY) - Abnormal; Notable for the following components:      Result Value   Clarity, UA cloudy (*)    Leukocytes, UA Trace (*)    All other  components within normal limits  POCT URINE PREGNANCY  CERVICOVAGINAL ANCILLARY ONLY    EKG   Radiology No results found.  Procedures Procedures (including critical care time)  Medications Ordered in UC Medications - No data to display  Initial Impression / Assessment and Plan / UC Course  I have reviewed the triage vital signs and the nursing notes.  Pertinent labs & imaging results that were available during my care of the patient were reviewed by me and considered in my medical decision making (see chart for details).     Vitals and exam reassuring, urine pregnant negative, UA with trace leukocytes likely indicating a recurrence of vaginal infection rather than a UTI.  Vaginal swab pending, will treat based on these results.  Discussed boric acid suppositories, probiotics, good vaginal hygiene.  GYN information given for ongoing follow-up.  Final Clinical Impressions(s) / UC Diagnoses   Final diagnoses:  Vaginal discharge     Discharge Instructions     May try probiotics, boric acid suppositories that you can buy over-the-counter while awaiting results to see if this will help reduce your vaginal symptoms    ED Prescriptions    None     PDMP not reviewed this encounter.   Roosvelt Maser [place], [place] 08/04/20 [phone]

## 2020-08-05 ENCOUNTER — Telehealth (HOSPITAL_COMMUNITY): Payer: Self-pay | Admitting: Emergency Medicine

## 2020-08-05 LAB — CERVICOVAGINAL ANCILLARY ONLY
Bacterial Vaginitis (gardnerella): NEGATIVE
Candida Glabrata: NEGATIVE
Candida Vaginitis: POSITIVE — AB
Chlamydia: NEGATIVE
Comment: NEGATIVE
Comment: NEGATIVE
Comment: NEGATIVE
Comment: NEGATIVE
Comment: NEGATIVE
Comment: NORMAL
Neisseria Gonorrhea: NEGATIVE
Trichomonas: NEGATIVE

## 2020-08-05 MED ORDER — FLUCONAZOLE 150 MG PO TABS: 150.0000 mg | ORAL_TABLET | Freq: Once | ORAL | 0 refills | Status: AC

## 2020-08-25 ENCOUNTER — Other Ambulatory Visit: Payer: Self-pay

## 2020-08-25 ENCOUNTER — Encounter: Payer: Self-pay | Admitting: Emergency Medicine

## 2020-08-25 ENCOUNTER — Ambulatory Visit
Admission: EM | Admit: 2020-08-25 | Discharge: 2020-08-25 | Disposition: A | Discharge status: Home / Self Care | Payer: Medicaid Other | Attending: Emergency Medicine | Admitting: Emergency Medicine

## 2020-08-25 DIAGNOSIS — Z3202 Encounter for pregnancy test, result negative: Secondary | ICD-10-CM | POA: Diagnosis not present

## 2020-08-25 DIAGNOSIS — Z113 Encounter for screening for infections with a predominantly sexual mode of transmission: Secondary | ICD-10-CM | POA: Insufficient documentation

## 2020-08-25 DIAGNOSIS — R319 Hematuria, unspecified: Secondary | ICD-10-CM | POA: Diagnosis not present

## 2020-08-25 DIAGNOSIS — N390 Urinary tract infection, site not specified: Secondary | ICD-10-CM | POA: Diagnosis not present

## 2020-08-25 LAB — POCT URINALYSIS DIP (MANUAL ENTRY)
Bilirubin, UA: NEGATIVE
Glucose, UA: NEGATIVE mg/dL
Ketones, POC UA: NEGATIVE mg/dL
Nitrite, UA: NEGATIVE
Protein Ur, POC: NEGATIVE mg/dL
Spec Grav, UA: 1.02 (ref 1.010–1.025)
Urobilinogen, UA: 0.2 E.U./dL
pH, UA: 5.5 (ref 5.0–8.0)

## 2020-08-25 LAB — POCT URINE PREGNANCY: Preg Test, Ur: NEGATIVE

## 2020-08-25 MED ORDER — PHENAZOPYRIDINE HCL 200 MG PO TABS: 200.0000 mg | ORAL_TABLET | Freq: Three times a day (TID) | ORAL | 0 refills | Status: DC | PRN

## 2020-08-25 MED ORDER — NITROFURANTOIN MONOHYD MACRO 100 MG PO CAPS: 100.0000 mg | ORAL_CAPSULE | Freq: Two times a day (BID) | ORAL | 0 refills | Status: AC

## 2020-08-25 NOTE — ED Triage Notes (Signed)
Pt here for STD screening with vaginal discharge and also sts LMP 06/21/2020

## 2020-08-25 NOTE — Discharge Instructions (Addendum)
Your urine pregnancy was negative.  If you are 2 months pregnant, then it would show up in the urine.  Your urinalysis is suggestive of urinary tract infection, so I am sending you home on Macrobid in addition to the Pyridium for your symptoms.  Finish the [employer], unless a provider tells you to stop.  We will contact you if your vaginal swab results come back abnormal and need treatment.  You can either follow-up with the [facility] or with a primary care provider of your choice for routine care.  Below is a list of primary care practices who are taking new patients for you to follow-up with.  [facility] internal medicine clinic Ground Floor - [facility], [address] [place], [place], [place] [address] [phone]  [facility] at [facility] [address] [place], [place] [address] [phone]  [facility] [address] [place], [place] [address] [phone]  [facility] [phone] [address] [place] [address]  [facility]: [address] [place] [address]  [phone]  [facility]: [address] [place] [address]   [phone]  [facility]: [address] [place] [address]  [phone]  [place] [facility] : [address] [place] [address] [phone]  [facility] Primary Care: [address] [place] [address] [phone]  [facility]: [address] [place] [address] [phone]  Dr. Oneal Grout [address] [facility] [place] [address]  [phone]  Go to www.goodrx.com  or www.costplusdrugs.com to look up your medications. This will give you a list of where you can find your prescriptions at  the most affordable prices. Or ask the pharmacist what the cash price is, or if they have any other discount programs available to help make your medication more affordable. This can be less expensive than what you would pay with insurance.

## 2020-08-25 NOTE — ED Provider Notes (Signed)
HPI  SUBJECTIVE:  Melissa Rowland is a 25 y.o. female who presents with 3 issues: First, she reports burning dysuria, urgency for the past 3 days.  No frequency, cloudy or odorous urine, hematuria.  No nausea or vomiting, fevers, abdominal, back, pelvic pain.  No vaginal odor, itching, bleeding, discharge, rash.  No recent antibiotics.  No scented soaps or body washes.  Second, she states that she has not not had a period since 3/21.  She has been irregular in the past, is not on any birth control.  Third, she is requesting STD screening.  She is sexually active with a female, who is asymptomatic, she denies having any other partners.  Is not sure about him.  She has tried unscented soap with improvement in her symptoms.  No aggravating factors.  Past medical history of trichomonas, frequent BV, yeast infections.  No history of other STDs, UTI, pyelonephritis, nephrolithiasis.  She is a G2 P1.  PMD: None.  History reviewed. No pertinent past medical history.  History reviewed. No pertinent surgical history.  Family History  Problem Relation Age of Onset  . Healthy Mother   . Healthy Father     Social History   Tobacco Use  . Smoking status: Never Smoker  . Smokeless tobacco: Never Used  Vaping Use  . Vaping Use: Never used  Substance Use Topics  . Alcohol use: Yes    Comment: socially  . Drug use: Never    No current facility-administered medications for this encounter.  Current Outpatient Medications:  .  nitrofurantoin, macrocrystal-monohydrate, (MACROBID) 100 MG capsule, Take 1 capsule (100 mg total) by mouth 2 (two) times daily for 5 days., Disp: 10 capsule, Rfl: 0 .  phenazopyridine (PYRIDIUM) 200 MG tablet, Take 1 tablet (200 mg total) by mouth 3 (three) times daily as needed for pain., Disp: 6 tablet, Rfl: 0 .  metroNIDAZOLE (FLAGYL) 500 MG tablet, Take 1 tablet (500 mg total) by mouth 2 (two) times daily. (Patient not taking: Reported on 08/25/2020), Disp: 14 tablet, Rfl:  0  No Known Allergies   ROS  As noted in HPI.   Physical Exam  BP 133/79 (BP Location: Left Arm)   Pulse 85   Temp 98.3 F (36.8 C) (Oral)   Resp 18   SpO2 98%   Constitutional: Well developed, well nourished, no acute distress Eyes:  EOMI, conjunctiva normal bilaterally HENT: Normocephalic, atraumatic,mucus membranes moist Respiratory: Normal inspiratory effort Cardiovascular: Normal rate GI: nondistended no suprapubic, flank tenderness Back: No CVAT skin: No rash, skin intact Musculoskeletal: no deformities Neurologic: Alert & oriented x 3, no focal neuro deficits Psychiatric: Speech and behavior appropriate   ED Course   Medications - No data to display  Orders Placed This Encounter  Procedures  . Urine Culture    Standing Status:   Standing    Number of Occurrences:   1    Order Specific Question:   List patient's active antibiotics    Answer:   Macrobid  . POCT urinalysis dipstick    Standing Status:   Standing    Number of Occurrences:   1  . POCT urine pregnancy    Standing Status:   Standing    Number of Occurrences:   1    Results for orders placed or performed during the hospital encounter of 08/25/20 (from the past 24 hour(s))  POCT urinalysis dipstick     Status: Abnormal   Collection Time: 08/25/20 12:13 PM  Result Value Ref Range  Color, UA yellow yellow   Clarity, UA cloudy (A) clear   Glucose, UA negative negative mg/dL   Bilirubin, UA negative negative   Ketones, POC UA negative negative mg/dL   Spec Grav, UA 7.371 0.626 - 1.025   Blood, UA trace-lysed (A) negative   pH, UA 5.5 5.0 - 8.0   Protein Ur, POC negative negative mg/dL   Urobilinogen, UA 0.2 0.2 or 1.0 E.U./dL   Nitrite, UA Negative Negative   Leukocytes, UA Trace (A) Negative  POCT urine pregnancy     Status: None   Collection Time: 08/25/20 12:13 PM  Result Value Ref Range   Preg Test, Ur Negative Negative   No results found.  ED Clinical Impression  1. Urinary  tract infection with hematuria, site unspecified   2. Negative pregnancy test   3. Screening for STDs (sexually transmitted diseases)      ED Assessment/Plan  Urine pregnancy negative.  Patient has trace leukocytes.  Given her history, will treat as a urinary tract infection with Macrobid and Pyridium.  Urine culture sent to confirm antibiotic choice.  Vaginal swab for gonorrhea, chlamydia, trichomonas, BV, yeast sent.  Patient declined HIV and RPR testing.  May follow-up with [facility] on [address], or a primary care provider of her choice for routine care.  Providing primary care list and will order assistance in finding a PMD.  Discussed labs,  MDM, treatment plan, and plan for follow-up with patient.  patient agrees with plan.   Meds ordered this encounter  Medications  . nitrofurantoin, macrocrystal-monohydrate, (MACROBID) 100 MG capsule    Sig: Take 1 capsule (100 mg total) by mouth 2 (two) times daily for 5 days.    Dispense:  10 capsule    Refill:  0  . phenazopyridine (PYRIDIUM) 200 MG tablet    Sig: Take 1 tablet (200 mg total) by mouth 3 (three) times daily as needed for pain.    Dispense:  6 tablet    Refill:  0      *This clinic note was created using Scientist, clinical (histocompatibility and immunogenetics). Therefore, there may be occasional mistakes despite careful proofreading.  ?    Domenick Gong, MD 08/26/20 [phone]

## 2020-08-27 ENCOUNTER — Telehealth (HOSPITAL_COMMUNITY): Payer: Self-pay | Admitting: Emergency Medicine

## 2020-08-27 LAB — CERVICOVAGINAL ANCILLARY ONLY
Bacterial Vaginitis (gardnerella): POSITIVE — AB
Candida Glabrata: NEGATIVE
Candida Vaginitis: POSITIVE — AB
Chlamydia: NEGATIVE
Comment: NEGATIVE
Comment: NEGATIVE
Comment: NEGATIVE
Comment: NEGATIVE
Comment: NEGATIVE
Comment: NORMAL
Neisseria Gonorrhea: NEGATIVE
Trichomonas: NEGATIVE

## 2020-08-27 MED ORDER — METRONIDAZOLE 0.75 % VA GEL: 1.0000 | Freq: Every day | VAGINAL | 0 refills | Status: AC

## 2020-08-27 MED ORDER — FLUCONAZOLE 150 MG PO TABS: 150.0000 mg | ORAL_TABLET | Freq: Once | ORAL | 0 refills | Status: AC

## 2020-08-28 LAB — URINE CULTURE: Culture: 20000 — AB

## 2020-08-30 ENCOUNTER — Telehealth (HOSPITAL_COMMUNITY): Payer: Self-pay | Admitting: Emergency Medicine

## 2020-08-30 MED ORDER — SULFAMETHOXAZOLE-TRIMETHOPRIM 800-160 MG PO TABS: 1.0000 | ORAL_TABLET | Freq: Two times a day (BID) | ORAL | 0 refills | Status: AC

## 2020-09-02 ENCOUNTER — Ambulatory Visit: Payer: Medicaid Other | Admitting: Nurse Practitioner

## 2020-09-06 ENCOUNTER — Other Ambulatory Visit: Payer: Self-pay

## 2020-09-06 ENCOUNTER — Ambulatory Visit
Admission: EM | Admit: 2020-09-06 | Discharge: 2020-09-06 | Disposition: A | Discharge status: Home / Self Care | Payer: Medicaid Other | Attending: Emergency Medicine | Admitting: Emergency Medicine

## 2020-09-06 DIAGNOSIS — R102 Pelvic and perineal pain: Secondary | ICD-10-CM | POA: Insufficient documentation

## 2020-09-06 LAB — POCT URINALYSIS DIP (MANUAL ENTRY)
Bilirubin, UA: NEGATIVE
Glucose, UA: NEGATIVE mg/dL
Ketones, POC UA: NEGATIVE mg/dL
Nitrite, UA: NEGATIVE
Protein Ur, POC: NEGATIVE mg/dL
Spec Grav, UA: 1.025 (ref 1.010–1.025)
Urobilinogen, UA: 4 E.U./dL — AB
pH, UA: 6.5 (ref 5.0–8.0)

## 2020-09-06 LAB — POCT URINE PREGNANCY: Preg Test, Ur: NEGATIVE

## 2020-09-06 MED ORDER — METRONIDAZOLE 0.75 % VA GEL: 1.0000 | Freq: Every day | VAGINAL | 0 refills | Status: AC

## 2020-09-06 MED ORDER — IBUPROFEN 800 MG PO TABS: 800.0000 mg | ORAL_TABLET | Freq: Three times a day (TID) | ORAL | 0 refills | Status: DC

## 2020-09-06 MED ORDER — TRIAMCINOLONE ACETONIDE 0.1 % EX CREA: 1.0000 "application " | TOPICAL_CREAM | Freq: Two times a day (BID) | CUTANEOUS | 0 refills | Status: DC

## 2020-09-06 NOTE — Discharge Instructions (Addendum)
Ibuprofen and Tylenol for pain Continue MetroGel x5 days May use triamcinolone to clitoris twice daily as needed to help with any localized swelling/inflammation Follow-up if not improving or worsening

## 2020-09-06 NOTE — ED Triage Notes (Signed)
Three day h/o "sharp pricking pain" on clitoris, noting that at the onset the pain interrupted her sleep. Denies hematuria and dysuria. Denies discharge and odor. No vaginal itching.  Pt was prescribed pyridium on 08/25/20 but notes that she did not take the meds.

## 2020-09-06 NOTE — ED Provider Notes (Signed)
[name]    CSN: [id number] Arrival date & time: 09/06/20  1600      History   Chief Complaint Chief Complaint  Patient presents with  . Vaginal Pain    HPI Melissa Rowland is a 25 y.o. female presenting today for evaluation of pelvic pain.  Patient reports 3 days ago developed a sharp 10 out of 10 pain to her clitoris that woke her up out of sleep.  Since she has had a more mild intermittent pain to this area which she describes as being a 3 out of 10.  She was recently seen here approximately 1 week ago and treated for UTI, as well as BV and yeast.  She completed course of antibiotics as well as Diflucan.  Has been using a couple days of MetroGel, but reports that she felt the applicator was painful with applying the cream vaginally.  Denies abdominal pain nausea or vomiting.  HPI  History reviewed. No pertinent past medical history.  There are no problems to display for this patient.   History reviewed. No pertinent surgical history.  OB History   No obstetric history on file.      Home Medications    Prior to Admission medications   Medication Sig Start Date End Date Taking? Authorizing Provider  ibuprofen (ADVIL) 800 MG tablet Take 1 tablet (800 mg total) by mouth 3 (three) times daily. 09/06/20  Yes Teoman Giraud C, PA-C  metroNIDAZOLE (METROGEL VAGINAL) 0.75 % vaginal gel Place 1 Applicatorful vaginally at bedtime for 5 days. 09/06/20 09/11/20 Yes Chavis Tessler C, PA-C  triamcinolone cream (KENALOG) 0.1 % Apply 1 application topically 2 (two) times daily. 09/06/20  Yes Destony Prevost C, PA-C  phenazopyridine (PYRIDIUM) 200 MG tablet Take 1 tablet (200 mg total) by mouth 3 (three) times daily as needed for pain. 08/25/20   Domenick Gong, MD    Family History Family History  Problem Relation Age of Onset  . Healthy Mother   . Healthy Father     Social History Social History   Tobacco Use  . Smoking status: Never Smoker  . Smokeless tobacco:  Never Used  Vaping Use  . Vaping Use: Never used  Substance Use Topics  . Alcohol use: Yes    Comment: socially  . Drug use: Never     Allergies   Patient has no known allergies.   Review of Systems Review of Systems  Constitutional: Negative for fever.  Respiratory: Negative for shortness of breath.   Cardiovascular: Negative for chest pain.  Gastrointestinal: Negative for abdominal pain, diarrhea, nausea and vomiting.  Genitourinary: Positive for pelvic pain. Negative for dysuria, flank pain, genital sores, hematuria, menstrual problem, vaginal bleeding, vaginal discharge and vaginal pain.  Musculoskeletal: Negative for back pain.  Skin: Negative for rash.  Neurological: Negative for dizziness, light-headedness and headaches.     Physical Exam Triage Vital Signs ED Triage Vitals [09/06/20 1900]  Enc Vitals Group     BP      Pulse      Resp      Temp      Temp src      SpO2      Weight      Height      Head Circumference      Peak Flow      Pain Score 0     Pain Loc      Pain Edu?      Excl. in GC?    No  data found.  Updated Vital Signs LMP 06/21/2020 (Exact Date)   Visual Acuity Right Eye Distance:   Left Eye Distance:   Bilateral Distance:    Right Eye Near:   Left Eye Near:    Bilateral Near:     Physical Exam Vitals and nursing note reviewed.  Constitutional:      Appearance: She is well-developed.     Comments: No acute distress  HENT:     Head: Normocephalic and atraumatic.     Nose: Nose normal.  Eyes:     Conjunctiva/sclera: Conjunctivae normal.  Cardiovascular:     Rate and Rhythm: Normal rate.  Pulmonary:     Effort: Pulmonary effort is normal. No respiratory distress.  Abdominal:     General: There is no distension.  Genitourinary:    Comments: Normal external female genitalia, no obvious rashes or lesions, no notable swelling, nontender to palpation around clitoris or clitoral hood or bilateral labia majora/minora, no obvious  swelling or erythema noted to clitoris, vagina with large amount of white thick substance/discharge present (has been using MetroGel) Musculoskeletal:        General: Normal range of motion.     Cervical back: Neck supple.  Skin:    General: Skin is warm and dry.  Neurological:     Mental Status: She is alert and oriented to person, place, and time.      UC Treatments / Results  Labs (all labs ordered are listed, but only abnormal results are displayed) Labs Reviewed  POCT URINALYSIS DIP (MANUAL ENTRY) - Abnormal; Notable for the following components:      Result Value   Clarity, UA hazy (*)    Blood, UA trace-intact (*)    Urobilinogen, UA 4.0 (*)    Leukocytes, UA Trace (*)    All other components within normal limits  URINE CULTURE  POCT URINE PREGNANCY  CERVICOVAGINAL ANCILLARY ONLY    EKG   Radiology No results found.  Procedures Procedures (including critical care time)  Medications Ordered in UC Medications - No data to display  Initial Impression / Assessment and Plan / UC Course  I have reviewed the triage vital signs and the nursing notes.  Pertinent labs & imaging results that were available during my care of the patient were reviewed by me and considered in my medical decision making (see chart for details).     No obvious abnormality noted on pelvic exam to cause pain, did provide triamcinolone to apply topically to clitoris if having any swelling/inflammation, anti-inflammatories as needed, continue course of MetroGel and monitor for gradual resolution of symptoms.  We will recheck urine culture and vaginal swab to ensure resolution of prior infections.  Discussed strict return precautions. Patient verbalized understanding and is agreeable with plan.  Final Clinical Impressions(s) / UC Diagnoses   Final diagnoses:  Pelvic pain in female     Discharge Instructions     Ibuprofen and Tylenol for pain Continue MetroGel x5 days May use  triamcinolone to clitoris twice daily as needed to help with any localized swelling/inflammation Follow-up if not improving or worsening    ED Prescriptions    Medication Sig Dispense Auth. Provider   triamcinolone cream (KENALOG) 0.1 % Apply 1 application topically 2 (two) times daily. 30 g Paulene Tayag C, PA-C   ibuprofen (ADVIL) 800 MG tablet Take 1 tablet (800 mg total) by mouth 3 (three) times daily. 21 tablet Tasean Mancha C, PA-C   metroNIDAZOLE (METROGEL VAGINAL) 0.75 % vaginal gel  Place 1 Applicatorful vaginally at bedtime for 5 days. 70 g Hilery Wintle, [place] C, PA-C     PDMP not reviewed this encounter.   Lew Dawes, [place] 09/06/20 2108

## 2020-09-08 LAB — CERVICOVAGINAL ANCILLARY ONLY
Bacterial Vaginitis (gardnerella): NEGATIVE
Candida Glabrata: NEGATIVE
Candida Vaginitis: NEGATIVE
Chlamydia: NEGATIVE
Comment: NEGATIVE
Comment: NEGATIVE
Comment: NEGATIVE
Comment: NEGATIVE
Comment: NEGATIVE
Comment: NORMAL
Neisseria Gonorrhea: NEGATIVE
Trichomonas: NEGATIVE

## 2020-09-08 LAB — URINE CULTURE

## 2020-11-03 ENCOUNTER — Other Ambulatory Visit: Payer: Self-pay

## 2020-11-03 ENCOUNTER — Ambulatory Visit
Admission: EM | Admit: 2020-11-03 | Discharge: 2020-11-03 | Disposition: A | Discharge status: Home / Self Care | Payer: Medicaid Other | Attending: Family Medicine | Admitting: Family Medicine

## 2020-11-03 DIAGNOSIS — N760 Acute vaginitis: Secondary | ICD-10-CM | POA: Insufficient documentation

## 2020-11-03 DIAGNOSIS — N898 Other specified noninflammatory disorders of vagina: Secondary | ICD-10-CM | POA: Diagnosis present

## 2020-11-03 LAB — POCT URINALYSIS DIP (MANUAL ENTRY)
Bilirubin, UA: NEGATIVE
Glucose, UA: NEGATIVE mg/dL
Ketones, POC UA: NEGATIVE mg/dL
Leukocytes, UA: NEGATIVE
Nitrite, UA: NEGATIVE
Protein Ur, POC: NEGATIVE mg/dL
Spec Grav, UA: >=1.03 — AB (ref 1.010–1.025)
Urobilinogen, UA: 0.2 E.U./dL
pH, UA: 5.5 (ref 5.0–8.0)

## 2020-11-03 MED ORDER — METRONIDAZOLE 0.75 % VA GEL: 1.0000 | Freq: Two times a day (BID) | VAGINAL | 0 refills | Status: DC

## 2020-11-03 NOTE — Discharge Instructions (Addendum)
May try boric acid suppositories over-the-counter to see if this helps with your symptoms

## 2020-11-03 NOTE — ED Provider Notes (Signed)
[name]    CSN: [id number] Arrival date & time: 11/03/20  0955      History   Chief Complaint Chief Complaint  Patient presents with   vaginal discomfort    HPI Melissa Rowland is a 25 y.o. female.   Patient presenting today with several day history of sharp brief vaginal pains every now and then, abnormal vaginal discharge, mild cramping and bloating.  Denies dysuria, hematuria, flank pain, abdominal pain, nausea vomiting diarrhea, fevers.  Does have a new sexual partner and has a history of BV that is felt similar.  Not trying anything over-the-counter for symptoms.   History reviewed. No pertinent past medical history.  There are no problems to display for this patient.   History reviewed. No pertinent surgical history.  OB History   No obstetric history on file.      Home Medications    Prior to Admission medications   Medication Sig Start Date End Date Taking? Authorizing Provider  metroNIDAZOLE (METROGEL) 0.75 % vaginal gel Place 1 Applicatorful vaginally 2 (two) times daily. 11/03/20  Yes Particia Nearing, PA-C  ibuprofen (ADVIL) 800 MG tablet Take 1 tablet (800 mg total) by mouth 3 (three) times daily. 09/06/20   Wieters, Hallie C, PA-C  phenazopyridine (PYRIDIUM) 200 MG tablet Take 1 tablet (200 mg total) by mouth 3 (three) times daily as needed for pain. 08/25/20   Domenick Gong, MD  triamcinolone cream (KENALOG) 0.1 % Apply 1 application topically 2 (two) times daily. 09/06/20   Wieters, Junius Creamer, PA-C    Family History Family History  Problem Relation Age of Onset   Healthy Mother    Healthy Father     Social History Social History   Tobacco Use   Smoking status: Never   Smokeless tobacco: Never  Vaping Use   Vaping Use: Never used  Substance Use Topics   Alcohol use: Yes    Comment: socially   Drug use: Never     Allergies   Patient has no known allergies.   Review of Systems Review of Systems Per HPI  Physical  Exam Triage Vital Signs ED Triage Vitals  Enc Vitals Group     BP 11/03/20 1040 116/76     Pulse Rate 11/03/20 1040 78     Resp 11/03/20 1040 18     Temp 11/03/20 1040 98.6 F (37 C)     Temp Source 11/03/20 1040 Oral     SpO2 11/03/20 1040 98 %     Weight --      Height --      Head Circumference --      Peak Flow --      Pain Score 11/03/20 1041 0     Pain Loc --      Pain Edu? --      Excl. in GC? --    No data found.  Updated Vital Signs BP 116/76 (BP Location: Left Arm)   Pulse 78   Temp 98.6 F (37 C) (Oral)   Resp 18   LMP 10/11/2020 (Approximate)   SpO2 98%   Visual Acuity Right Eye Distance:   Left Eye Distance:   Bilateral Distance:    Right Eye Near:   Left Eye Near:    Bilateral Near:     Physical Exam Vitals and nursing note reviewed.  Constitutional:      Appearance: Normal appearance. She is not ill-appearing.  HENT:     Head: Atraumatic.  Mouth/Throat:     Mouth: Mucous membranes are moist.  Eyes:     Extraocular Movements: Extraocular movements intact.     Conjunctiva/sclera: Conjunctivae normal.  Cardiovascular:     Rate and Rhythm: Normal rate and regular rhythm.     Heart sounds: Normal heart sounds.  Pulmonary:     Effort: Pulmonary effort is normal.     Breath sounds: Normal breath sounds.  Abdominal:     General: Bowel sounds are normal. There is no distension.     Palpations: Abdomen is soft.     Tenderness: There is no abdominal tenderness. There is no right CVA tenderness, left CVA tenderness or guarding.  Genitourinary:    Comments: GU exam deferred, self swab performed Musculoskeletal:        General: Normal range of motion.     Cervical back: Normal range of motion and neck supple.  Skin:    General: Skin is warm and dry.  Neurological:     Mental Status: She is alert and oriented to person, place, and time.  Psychiatric:        Mood and Affect: Mood normal.        Thought Content: Thought content normal.         Judgment: Judgment normal.     UC Treatments / Results  Labs (all labs ordered are listed, but only abnormal results are displayed) Labs Reviewed  POCT URINALYSIS DIP (MANUAL ENTRY) - Abnormal; Notable for the following components:      Result Value   Spec Grav, UA >=1.030 (*)    Blood, UA trace-intact (*)    All other components within normal limits  CERVICOVAGINAL ANCILLARY ONLY    EKG   Radiology No results found.  Procedures Procedures (including critical care time)  Medications Ordered in UC Medications - No data to display  Initial Impression / Assessment and Plan / UC Course  I have reviewed the triage vital signs and the nursing notes.  Pertinent labs & imaging results that were available during my care of the patient were reviewed by me and considered in my medical decision making (see chart for details).     Signs and exam benign and reassuring, UA negative for urinary tract infection.  Vaginal swab pending.  Given her history of BV, will treat with topical metronidazole as she does not tolerate the pill version while awaiting results.  Discussed boric acid suppositories and other over-the-counter remedies, good vaginal hygiene and safe sexual practices.  Adjust as needed based on the remainder of the results.  Final Clinical Impressions(s) / UC Diagnoses   Final diagnoses:  Acute vaginitis  Vaginal discharge     Discharge Instructions      May try boric acid suppositories over-the-counter to see if this helps with your symptoms     ED Prescriptions     Medication Sig Dispense Auth. Provider   metroNIDAZOLE (METROGEL) 0.75 % vaginal gel Place 1 Applicatorful vaginally 2 (two) times daily. 140 g Particia Nearing, [place]      PDMP not reviewed this encounter.   Particia Nearing, [place] 11/03/20 1146

## 2020-11-03 NOTE — ED Triage Notes (Signed)
Pt c/o vaginal sharp pain that comes and goes lasting seconds. Denies itching burning but states increased voiding and sometimes discharge.

## 2020-11-04 LAB — CERVICOVAGINAL ANCILLARY ONLY
Bacterial Vaginitis (gardnerella): NEGATIVE
Candida Glabrata: NEGATIVE
Candida Vaginitis: NEGATIVE
Chlamydia: NEGATIVE
Comment: NEGATIVE
Comment: NEGATIVE
Comment: NEGATIVE
Comment: NEGATIVE
Comment: NEGATIVE
Comment: NORMAL
Neisseria Gonorrhea: NEGATIVE
Trichomonas: NEGATIVE

## 2020-11-10 ENCOUNTER — Ambulatory Visit
Admission: RE | Admit: 2020-11-10 | Discharge: 2020-11-10 | Disposition: A | Discharge status: Home / Self Care | Payer: Medicaid Other | Source: Ambulatory Visit | Attending: Urgent Care | Admitting: Urgent Care

## 2020-11-10 ENCOUNTER — Other Ambulatory Visit: Payer: Self-pay

## 2020-11-10 VITALS — BP 115/78 | HR 83 | Temp 98.4°F | Resp 18

## 2020-11-10 DIAGNOSIS — H6591 Unspecified nonsuppurative otitis media, right ear: Secondary | ICD-10-CM | POA: Diagnosis not present

## 2020-11-10 DIAGNOSIS — H6121 Impacted cerumen, right ear: Secondary | ICD-10-CM

## 2020-11-10 MED ORDER — CARBAMIDE PEROXIDE 6.5 % OT SOLN: 5.0000 [drp] | Freq: Two times a day (BID) | OTIC | 0 refills | Status: DC

## 2020-11-10 MED ORDER — AMOXICILLIN 875 MG PO TABS: 875.0000 mg | ORAL_TABLET | Freq: Two times a day (BID) | ORAL | 0 refills | Status: DC

## 2020-11-10 MED ORDER — CETIRIZINE HCL 10 MG PO TABS: 10.0000 mg | ORAL_TABLET | Freq: Every day | ORAL | 0 refills | Status: DC

## 2020-11-10 MED ORDER — FLUTICASONE PROPIONATE 50 MCG/ACT NA SUSP: 2.0000 | Freq: Every day | NASAL | 12 refills | Status: DC

## 2020-11-10 MED ORDER — PSEUDOEPHEDRINE HCL 60 MG PO TABS: 60.0000 mg | ORAL_TABLET | Freq: Three times a day (TID) | ORAL | 0 refills | Status: DC | PRN

## 2020-11-10 NOTE — ED Triage Notes (Signed)
Pt states feels like something is moving in her rt ear x2 days. Denies pain.

## 2020-11-10 NOTE — ED Provider Notes (Signed)
[facility]   MRN: [id number] DOB: 1996/02/16  Subjective:   Melissa Rowland is a 25 y.o. female presenting for 2-day history of acute onset right ear fullness, discomfort.  States that she feels like something is moving in her ear.  Denies fever, dizziness, ear drainage, tinnitus, pain, runny or stuffy nose, sore throat, cough.  Patient does use Q-tips regularly to clean her ears.  No current facility-administered medications for this encounter. No current outpatient medications on file.   No Known Allergies  History reviewed. No pertinent past medical history.   History reviewed. No pertinent surgical history.  Family History  Problem Relation Age of Onset   Healthy Mother    Healthy Father     Social History   Tobacco Use   Smoking status: Never   Smokeless tobacco: Never  Vaping Use   Vaping Use: Never used  Substance Use Topics   Alcohol use: Yes    Comment: socially   Drug use: Never    ROS   Objective:   Vitals: BP 115/78 (BP Location: Left Arm)   Pulse 83   Temp 98.4 F (36.9 C) (Oral)   Resp 18   LMP 10/11/2020 (Approximate)   SpO2 98%   Physical Exam Constitutional:      General: She is not in acute distress.    Appearance: Normal appearance. She is well-developed. She is not ill-appearing, toxic-appearing or diaphoretic.  HENT:     Head: Normocephalic and atraumatic.     Comments: Status post right ear lavage, TM with effusion.    Right Ear: There is impacted cerumen.     Left Ear: Tympanic membrane, ear canal and external ear normal. There is no impacted cerumen.     Nose: Nose normal.     Mouth/Throat:     Mouth: Mucous membranes are moist.     Pharynx: Oropharynx is clear.  Eyes:     General: No scleral icterus.       Right eye: No discharge.        Left eye: No discharge.     Extraocular Movements: Extraocular movements intact.     Conjunctiva/sclera: Conjunctivae normal.     Pupils: Pupils are equal, round, and  reactive to light.  Cardiovascular:     Rate and Rhythm: Normal rate.  Pulmonary:     Effort: Pulmonary effort is normal.  Skin:    General: Skin is warm and dry.  Neurological:     General: No focal deficit present.     Mental Status: She is alert and oriented to person, place, and time.  Psychiatric:        Mood and Affect: Mood normal.        Behavior: Behavior normal.        Thought Content: Thought content normal.        Judgment: Judgment normal.    Ear lavage performed using mixture of peroxide and water.  Pressure irrigation performed using a bottle and a thin ear tube.  Right ear lavage.  Curette was also used for manual removal.   Assessment and Plan :   PDMP not reviewed this encounter.  1. Impacted cerumen of right ear     Successful right ear lavage.  General management of cerumen impaction reviewed with patient.  Anticipatory guidance provided.  Recommend starting triple therapy with Flonase, Zyrtec and pseudoephedrine  (prn): I suspect is eustachian tube dysfunction.  If symptoms fail to improve in the next 2 to 3 days and definitely  worsen, offered her printed prescription of amoxicillin to address otitis media.  Counseled patient on potential for adverse effects with medications prescribed/recommended today, ER and return-to-clinic precautions discussed, patient verbalized understanding.    Wallis Bamberg, PA-C 11/10/20 1344

## 2020-11-24 ENCOUNTER — Other Ambulatory Visit: Payer: Self-pay

## 2020-11-24 ENCOUNTER — Ambulatory Visit
Admission: RE | Admit: 2020-11-24 | Discharge: 2020-11-24 | Disposition: A | Discharge status: Home / Self Care | Payer: Medicaid Other | Source: Ambulatory Visit | Attending: Urgent Care | Admitting: Urgent Care

## 2020-11-24 VITALS — BP 110/74 | HR 82 | Temp 98.1°F | Resp 18 | Ht 64.0 in | Wt 150.0 lb

## 2020-11-24 DIAGNOSIS — Z7251 High risk heterosexual behavior: Secondary | ICD-10-CM | POA: Diagnosis not present

## 2020-11-24 DIAGNOSIS — N898 Other specified noninflammatory disorders of vagina: Secondary | ICD-10-CM | POA: Insufficient documentation

## 2020-11-24 NOTE — ED Triage Notes (Signed)
Patient here for a STD check.  Patient having vaginal discharge x 2 days.  Vaginal discharge like a creamy color to it, no odor, no itchy or burning.  No OTC meds.

## 2020-11-24 NOTE — ED Provider Notes (Signed)
  [facility]   MRN: [id number] DOB: [birth year]  Subjective:   Melissa Rowland is a 25 y.o. female presenting for 2-day history of acute onset vaginal discharge.  Denies fever, nausea, vomiting, abdominal or pelvic pain, dysuria, hematuria.  LMP was a week ago.  Has no concern for pregnancy.  She does have unprotected sex, has 1 female partner.  Has had trouble with yeast and BV infections multiple times in this past year.  Has not seen a gynecologist being recommended one.  Denies taking chronic medications.  No Known Allergies  History reviewed. No pertinent past medical history.   History reviewed. No pertinent surgical history.  Family History  Problem Relation Age of Onset   Healthy Mother    Healthy Father     Social History   Tobacco Use   Smoking status: Never   Smokeless tobacco: Never  Vaping Use   Vaping Use: Never used  Substance Use Topics   Alcohol use: Yes    Comment: socially   Drug use: Never    ROS   Objective:   Vitals: BP 110/74 (BP Location: Left Arm)   Pulse 82   Temp 98.1 F (36.7 C) (Oral)   Resp 18   Ht 5\' 4"  (1.626 m)   Wt 150 lb (68 kg)   LMP 11/11/2020   SpO2 97%   BMI 25.75 kg/m   Physical Exam Constitutional:      General: She is not in acute distress.    Appearance: Normal appearance. She is well-developed. She is not ill-appearing.  HENT:     Head: Normocephalic and atraumatic.     Nose: Nose normal.     Mouth/Throat:     Mouth: Mucous membranes are moist.     Pharynx: Oropharynx is clear.  Eyes:     General: No scleral icterus.    Extraocular Movements: Extraocular movements intact.     Pupils: Pupils are equal, round, and reactive to light.  Cardiovascular:     Rate and Rhythm: Normal rate.  Pulmonary:     Effort: Pulmonary effort is normal.  Skin:    General: Skin is warm and dry.  Neurological:     General: No focal deficit present.     Mental Status: She is alert and oriented to person, place,  and time.  Psychiatric:        Mood and Affect: Mood normal.        Behavior: Behavior normal.      Assessment and Plan :   PDMP not reviewed this encounter.  1. Vaginal discharge   2. Unprotected sex     Offered patient empiric treatment but she declined for now.  Recommend that she establish care with the [facility].  We will base treatment off the results.  Patient declined a pregnancy test.   [facility], PA-C 11/24/20 1002

## 2020-11-25 LAB — CERVICOVAGINAL ANCILLARY ONLY
Bacterial Vaginitis (gardnerella): NEGATIVE
Candida Glabrata: NEGATIVE
Candida Vaginitis: NEGATIVE
Chlamydia: NEGATIVE
Comment: NEGATIVE
Comment: NEGATIVE
Comment: NEGATIVE
Comment: NEGATIVE
Comment: NEGATIVE
Comment: NORMAL
Neisseria Gonorrhea: NEGATIVE
Trichomonas: NEGATIVE

## 2020-12-17 ENCOUNTER — Ambulatory Visit: Payer: Self-pay

## 2020-12-22 ENCOUNTER — Ambulatory Visit
Admission: RE | Admit: 2020-12-22 | Discharge: 2020-12-22 | Disposition: A | Discharge status: Home / Self Care | Payer: Medicaid Other | Source: Ambulatory Visit | Attending: Urgent Care | Admitting: Urgent Care

## 2020-12-22 ENCOUNTER — Other Ambulatory Visit: Payer: Self-pay

## 2020-12-22 VITALS — BP 128/81 | HR 86 | Temp 98.4°F | Resp 16

## 2020-12-22 DIAGNOSIS — N898 Other specified noninflammatory disorders of vagina: Secondary | ICD-10-CM | POA: Insufficient documentation

## 2020-12-22 DIAGNOSIS — R350 Frequency of micturition: Secondary | ICD-10-CM | POA: Diagnosis present

## 2020-12-22 DIAGNOSIS — Z113 Encounter for screening for infections with a predominantly sexual mode of transmission: Secondary | ICD-10-CM | POA: Insufficient documentation

## 2020-12-22 LAB — POCT URINALYSIS DIP (MANUAL ENTRY)
Bilirubin, UA: NEGATIVE
Blood, UA: NEGATIVE
Glucose, UA: NEGATIVE mg/dL
Ketones, POC UA: NEGATIVE mg/dL
Leukocytes, UA: NEGATIVE
Nitrite, UA: NEGATIVE
Protein Ur, POC: NEGATIVE mg/dL
Spec Grav, UA: 1.02 (ref 1.010–1.025)
Urobilinogen, UA: 0.2 E.U./dL
pH, UA: 5.5 (ref 5.0–8.0)

## 2020-12-22 LAB — POCT URINE PREGNANCY: Preg Test, Ur: NEGATIVE

## 2020-12-22 NOTE — ED Provider Notes (Signed)
[facility]   MRN: [id number] DOB: 24-Feb-1996  Subjective:   Melissa Rowland is a 25 y.o. female presenting for 1 day history of acute onset vaginal itching is very mild.  Would like a complete STI check, urinalysis. Patient is sexually active with 1 female partner, no condom use. No OCP.  She is also had urinary frequency.  States that she drinks maybe 2 bottles of water a day but mostly juice and Gatorade.  Has occasional alcoholic drink. Denies fever, n/v, abdominal pain, pelvic pain, rashes, dysuria, hematuria, vaginal discharge.    No current facility-administered medications for this encounter. No current outpatient medications on file.   No Known Allergies  History reviewed. No pertinent past medical history.   History reviewed. No pertinent surgical history.  Family History  Problem Relation Age of Onset   Healthy Mother    Healthy Father     Social History   Tobacco Use   Smoking status: Never   Smokeless tobacco: Never  Vaping Use   Vaping Use: Never used  Substance Use Topics   Alcohol use: Yes    Comment: socially   Drug use: Never    ROS   Objective:   Vitals: BP 128/81 (BP Location: Left Arm)   Pulse 86   Temp 98.4 F (36.9 C) (Oral)   Resp 16   SpO2 99%   Physical Exam Constitutional:      General: She is not in acute distress.    Appearance: Normal appearance. She is well-developed. She is not ill-appearing, toxic-appearing or diaphoretic.  HENT:     Head: Normocephalic and atraumatic.     Right Ear: External ear normal.     Left Ear: External ear normal.     Nose: Nose normal.     Mouth/Throat:     Mouth: Mucous membranes are moist.     Pharynx: Oropharynx is clear.  Eyes:     General: No scleral icterus.       Right eye: No discharge.        Left eye: No discharge.     Extraocular Movements: Extraocular movements intact.     Conjunctiva/sclera: Conjunctivae normal.     Pupils: Pupils are equal, round, and reactive to  light.  Cardiovascular:     Rate and Rhythm: Normal rate.  Pulmonary:     Effort: Pulmonary effort is normal.  Skin:    General: Skin is warm and dry.  Neurological:     General: No focal deficit present.     Mental Status: She is alert and oriented to person, place, and time.  Psychiatric:        Mood and Affect: Mood normal.        Behavior: Behavior normal.        Thought Content: Thought content normal.        Judgment: Judgment normal.    Results for orders placed or performed during the hospital encounter of 12/22/20 (from the past 24 hour(s))  POCT urinalysis dipstick     Status: None   Collection Time: 12/22/20 10:36 AM  Result Value Ref Range   Color, UA yellow yellow   Clarity, UA clear clear   Glucose, UA negative negative mg/dL   Bilirubin, UA negative negative   Ketones, POC UA negative negative mg/dL   Spec Grav, UA 5.366 4.403 - 1.025   Blood, UA negative negative   pH, UA 5.5 5.0 - 8.0   Protein Ur, POC negative negative mg/dL   Urobilinogen,  UA 0.2 0.2 or 1.0 E.U./dL   Nitrite, UA Negative Negative   Leukocytes, UA Negative Negative  POCT urine pregnancy     Status: None   Collection Time: 12/22/20 10:36 AM  Result Value Ref Range   Preg Test, Ur Negative Negative   Recent Results (from the past 2160 hour(s))  POCT urinalysis dipstick     Status: Abnormal   Collection Time: 11/03/20 10:54 AM  Result Value Ref Range   Color, UA yellow yellow   Clarity, UA clear clear   Glucose, UA negative negative mg/dL   Bilirubin, UA negative negative   Ketones, POC UA negative negative mg/dL   Spec Grav, UA >=6.237 (A) 1.010 - 1.025   Blood, UA trace-intact (A) negative   pH, UA 5.5 5.0 - 8.0   Protein Ur, POC negative negative mg/dL   Urobilinogen, UA 0.2 0.2 or 1.0 E.U./dL   Nitrite, UA Negative Negative   Leukocytes, UA Negative Negative  Cervicovaginal ancillary only     Status: None   Collection Time: 11/03/20 11:23 AM  Result Value Ref Range    Neisseria Gonorrhea Negative    Chlamydia Negative    Trichomonas Negative    Bacterial Vaginitis (gardnerella) Negative    Candida Vaginitis Negative    Candida Glabrata Negative    Comment      Normal Reference Range Bacterial Vaginosis - Negative   Comment Normal Reference Range Candida Species - Negative    Comment Normal Reference Range Candida Galbrata - Negative    Comment Normal Reference Range Trichomonas - Negative    Comment Normal Reference Ranger Chlamydia - Negative    Comment      Normal Reference Range Neisseria Gonorrhea - Negative  Cervicovaginal ancillary only     Status: None   Collection Time: 11/24/20  9:39 AM  Result Value Ref Range   Neisseria Gonorrhea Negative    Chlamydia Negative    Trichomonas Negative    Bacterial Vaginitis (gardnerella) Negative    Candida Vaginitis Negative    Candida Glabrata Negative    Comment Normal Reference Range Candida Species - Negative    Comment Normal Reference Range Candida Galbrata - Negative    Comment Normal Reference Range Trichomonas - Negative    Comment Normal Reference Ranger Chlamydia - Negative    Comment      Normal Reference Range Neisseria Gonorrhea - Negative   Comment      Normal Reference Range Bacterial Vaginosis - Negative  POCT urinalysis dipstick     Status: None   Collection Time: 12/22/20 10:36 AM  Result Value Ref Range   Color, UA yellow yellow   Clarity, UA clear clear   Glucose, UA negative negative mg/dL   Bilirubin, UA negative negative   Ketones, POC UA negative negative mg/dL   Spec Grav, UA 6.283 1.517 - 1.025   Blood, UA negative negative   pH, UA 5.5 5.0 - 8.0   Protein Ur, POC negative negative mg/dL   Urobilinogen, UA 0.2 0.2 or 1.0 E.U./dL   Nitrite, UA Negative Negative   Leukocytes, UA Negative Negative  POCT urine pregnancy     Status: None   Collection Time: 12/22/20 10:36 AM  Result Value Ref Range   Preg Test, Ur Negative Negative    Assessment and Plan :    PDMP not reviewed this encounter.  1. Screen for STD (sexually transmitted disease)     STI testing pending.  Counseled patient that she should hydrate much more  consistently and avoid urinary irritants.  Labs pending including complete STI panel, urine culture.  Based off of her previous results which were negative I deferred empiric treatment and will have her focus on hydrating as above. Counseled patient on potential for adverse effects with medications prescribed/recommended today, ER and return-to-clinic precautions discussed, patient verbalized understanding.    Wallis Bamberg, PA-C 12/22/20 1105

## 2020-12-22 NOTE — Discharge Instructions (Signed)

## 2020-12-22 NOTE — ED Triage Notes (Addendum)
Requesting full STD check with HIV, mild vaginal itching x 1 day. States she does not want a pregnancy test

## 2020-12-23 LAB — CERVICOVAGINAL ANCILLARY ONLY
Bacterial Vaginitis (gardnerella): NEGATIVE
Candida Glabrata: NEGATIVE
Candida Vaginitis: NEGATIVE
Chlamydia: NEGATIVE
Comment: NEGATIVE
Comment: NEGATIVE
Comment: NEGATIVE
Comment: NEGATIVE
Comment: NEGATIVE
Comment: NORMAL
Neisseria Gonorrhea: NEGATIVE
Trichomonas: NEGATIVE

## 2020-12-23 LAB — URINE CULTURE: Culture: <10000 — AB

## 2020-12-23 LAB — HIV ANTIBODY (ROUTINE TESTING W REFLEX): HIV Screen 4th Generation wRfx: NONREACTIVE

## 2020-12-23 LAB — RPR: RPR Ser Ql: NONREACTIVE

## 2020-12-31 ENCOUNTER — Other Ambulatory Visit: Payer: Self-pay

## 2020-12-31 ENCOUNTER — Ambulatory Visit
Admission: RE | Admit: 2020-12-31 | Discharge: 2020-12-31 | Disposition: A | Discharge status: Home / Self Care | Payer: Medicaid Other | Source: Ambulatory Visit | Attending: Urgent Care | Admitting: Urgent Care

## 2020-12-31 VITALS — BP 121/74 | HR 86 | Temp 98.0°F | Resp 18

## 2020-12-31 DIAGNOSIS — N761 Subacute and chronic vaginitis: Secondary | ICD-10-CM | POA: Diagnosis not present

## 2020-12-31 DIAGNOSIS — N898 Other specified noninflammatory disorders of vagina: Secondary | ICD-10-CM

## 2020-12-31 LAB — POCT URINE PREGNANCY: Preg Test, Ur: NEGATIVE

## 2020-12-31 NOTE — ED Triage Notes (Signed)
Pt c/o vaginal itching and irritation 2 days ago that has now resolved. Pt requesting STD testing.

## 2020-12-31 NOTE — ED Provider Notes (Signed)
  [facility]   MRN: [id number] DOB: March 27, 1996  Subjective:   Sibel Khurana is a 25 y.o. female presenting for an episode of vaginal itching 2 days ago.  Symptoms have now resolved.  Patient would like to make sure she gets a cervical swab again. Denies fever, n/v, abdominal pain, pelvic pain, rashes, dysuria, urinary frequency, hematuria, vaginal discharge.  She has an appointment this next week with a new gynecologist.  Does not want a pregnancy test.  No current facility-administered medications for this encounter. No current outpatient medications on file.   No Known Allergies  History reviewed. No pertinent past medical history.   History reviewed. No pertinent surgical history.  Family History  Problem Relation Age of Onset   Healthy Mother    Healthy Father     Social History   Tobacco Use   Smoking status: Never   Smokeless tobacco: Never  Vaping Use   Vaping Use: Never used  Substance Use Topics   Alcohol use: Yes    Comment: socially   Drug use: Never    ROS   Objective:   Vitals: BP 121/74 (BP Location: Left Arm)   Pulse 86   Temp 98 F (36.7 C) (Oral)   Resp 18   LMP 12/12/2020   SpO2 98%   Physical Exam Constitutional:      General: She is not in acute distress.    Appearance: Normal appearance. She is well-developed. She is not ill-appearing, toxic-appearing or diaphoretic.  HENT:     Head: Normocephalic and atraumatic.     Nose: Nose normal.     Mouth/Throat:     Mouth: Mucous membranes are moist.     Pharynx: Oropharynx is clear.  Eyes:     General: No scleral icterus.       Right eye: No discharge.        Left eye: No discharge.     Extraocular Movements: Extraocular movements intact.     Conjunctiva/sclera: Conjunctivae normal.     Pupils: Pupils are equal, round, and reactive to light.  Cardiovascular:     Rate and Rhythm: Normal rate.  Pulmonary:     Effort: Pulmonary effort is normal.  Skin:    General: Skin  is warm and dry.  Neurological:     General: No focal deficit present.     Mental Status: She is alert and oriented to person, place, and time.  Psychiatric:        Mood and Affect: Mood normal.        Behavior: Behavior normal.        Thought Content: Thought content normal.        Judgment: Judgment normal.    Assessment and Plan :   PDMP not reviewed this encounter.  1. Subacute vaginitis   2. Vaginal itching     Patient does admit to trimming and clipping her hair in the vaginal area.  Recommended that she do this very carefully and avoid any irritation that can come from not sanitizing her equipment, using rusted metal razors.  Continue to hydrate well.  Keep appointment for follow-up with a new gynecologist. Counseled patient on potential for adverse effects with medications prescribed/recommended today, ER and return-to-clinic precautions discussed, patient verbalized understanding.    Wallis Bamberg, PA-C 12/31/20 1013

## 2021-01-03 LAB — CERVICOVAGINAL ANCILLARY ONLY
Bacterial Vaginitis (gardnerella): NEGATIVE
Candida Glabrata: NEGATIVE
Candida Vaginitis: NEGATIVE
Chlamydia: NEGATIVE
Comment: NEGATIVE
Comment: NEGATIVE
Comment: NEGATIVE
Comment: NEGATIVE
Comment: NEGATIVE
Comment: NORMAL
Neisseria Gonorrhea: NEGATIVE
Trichomonas: NEGATIVE

## 2021-04-03 ENCOUNTER — Ambulatory Visit
Admission: RE | Admit: 2021-04-03 | Discharge: 2021-04-03 | Disposition: A | Discharge status: Home / Self Care | Payer: Medicaid Other | Source: Ambulatory Visit | Attending: Internal Medicine | Admitting: Internal Medicine

## 2021-04-03 ENCOUNTER — Other Ambulatory Visit: Payer: Self-pay

## 2021-04-03 VITALS — BP 122/78 | HR 85 | Temp 97.7°F | Resp 18 | Ht 64.0 in | Wt 152.0 lb

## 2021-04-03 DIAGNOSIS — N898 Other specified noninflammatory disorders of vagina: Secondary | ICD-10-CM | POA: Diagnosis not present

## 2021-04-03 DIAGNOSIS — Z113 Encounter for screening for infections with a predominantly sexual mode of transmission: Secondary | ICD-10-CM | POA: Diagnosis not present

## 2021-04-03 DIAGNOSIS — N390 Urinary tract infection, site not specified: Secondary | ICD-10-CM | POA: Insufficient documentation

## 2021-04-03 LAB — POCT URINALYSIS DIP (MANUAL ENTRY)
Bilirubin, UA: NEGATIVE
Blood, UA: NEGATIVE
Glucose, UA: NEGATIVE mg/dL
Ketones, POC UA: NEGATIVE mg/dL
Nitrite, UA: NEGATIVE
Protein Ur, POC: NEGATIVE mg/dL
Spec Grav, UA: 1.025 (ref 1.010–1.025)
Urobilinogen, UA: 1 E.U./dL
pH, UA: 6.5 (ref 5.0–8.0)

## 2021-04-03 MED ORDER — NITROFURANTOIN MONOHYD MACRO 100 MG PO CAPS: 100.0000 mg | ORAL_CAPSULE | Freq: Two times a day (BID) | ORAL | 0 refills | Status: DC

## 2021-04-03 NOTE — ED Provider Notes (Signed)
[name]    CSN: [id number] Arrival date & time: 04/03/21  1200      History   Chief Complaint Chief Complaint  Patient presents with   Vaginitis    HPI Melissa Rowland is a 26 y.o. female.   Patient presents with vaginal odor that has been present since yesterday.  Patient reports that she thinks that she may have had some clear to white vaginal discharge as well.  Denies urinary burning, urinary frequency, pelvic pain, abdominal pain, fever, back pain, irregular vaginal bleeding.  Last menstrual cycle was 03/08/2021.  Denies any known exposure to STD but has had unprotected sexual intercourse recently.  Patient requesting urinalysis to rule out urinary tract infection.    History reviewed. No pertinent past medical history.  There are no problems to display for this patient.   History reviewed. No pertinent surgical history.  OB History   No obstetric history on file.      Home Medications    Prior to Admission medications   Medication Sig Start Date End Date Taking? Authorizing Provider  nitrofurantoin, macrocrystal-monohydrate, (MACROBID) 100 MG capsule Take 1 capsule (100 mg total) by mouth 2 (two) times daily. 04/03/21  Yes Kamin Niblack, Acie Fredrickson, FNP    Family History Family History  Problem Relation Age of Onset   Healthy Mother    Healthy Father     Social History Social History   Tobacco Use   Smoking status: Never   Smokeless tobacco: Never  Vaping Use   Vaping Use: Never used  Substance Use Topics   Alcohol use: Yes    Comment: socially   Drug use: Never     Allergies   Patient has no known allergies.   Review of Systems Review of Systems Per HPI  Physical Exam Triage Vital Signs ED Triage Vitals  Enc Vitals Group     BP 04/03/21 1305 122/78     Pulse Rate 04/03/21 1305 85     Resp 04/03/21 1305 18     Temp 04/03/21 1305 97.7 F (36.5 C)     Temp Source 04/03/21 1305 Oral     SpO2 04/03/21 1305 94 %     Weight 04/03/21  1304 152 lb (68.9 kg)     Height 04/03/21 1304 5\' 4"  (1.626 m)     Head Circumference --      Peak Flow --      Pain Score 04/03/21 1304 0     Pain Loc --      Pain Edu? --      Excl. in GC? --    No data found.  Updated Vital Signs BP 122/78 (BP Location: Left Arm)    Pulse 85    Temp 97.7 F (36.5 C) (Oral)    Resp 18    Ht 5\' 4"  (1.626 m)    Wt 152 lb (68.9 kg)    LMP 03/08/2021 (Exact Date)    SpO2 94%    BMI 26.09 kg/m   Visual Acuity Right Eye Distance:   Left Eye Distance:   Bilateral Distance:    Right Eye Near:   Left Eye Near:    Bilateral Near:     Physical Exam Constitutional:      General: She is not in acute distress.    Appearance: Normal appearance. She is not toxic-appearing or diaphoretic.  HENT:     Head: Normocephalic and atraumatic.  Eyes:     Extraocular Movements: Extraocular movements intact.  Conjunctiva/sclera: Conjunctivae normal.  Cardiovascular:     Rate and Rhythm: Normal rate and regular rhythm.     Pulses: Normal pulses.     Heart sounds: Normal heart sounds.  Pulmonary:     Effort: Pulmonary effort is normal. No respiratory distress.     Breath sounds: Normal breath sounds.  Abdominal:     General: Abdomen is flat. Bowel sounds are normal. There is no distension.     Palpations: Abdomen is soft.     Tenderness: There is no abdominal tenderness.  Genitourinary:    Comments: Deferred with shared decision making.  Self swab performed. Neurological:     General: No focal deficit present.     Mental Status: She is alert and oriented to person, place, and time. Mental status is at baseline.  Psychiatric:        Mood and Affect: Mood normal.        Behavior: Behavior normal.        Thought Content: Thought content normal.        Judgment: Judgment normal.     UC Treatments / Results  Labs (all labs ordered are listed, but only abnormal results are displayed) Labs Reviewed  POCT URINALYSIS DIP (MANUAL ENTRY) - Abnormal; Notable  for the following components:      Result Value   Leukocytes, UA Small (1+) (*)    All other components within normal limits  URINE CULTURE  CERVICOVAGINAL ANCILLARY ONLY    EKG   Radiology No results found.  Procedures Procedures (including critical care time)  Medications Ordered in UC Medications - No data to display  Initial Impression / Assessment and Plan / UC Course  I have reviewed the triage vital signs and the nursing notes.  Pertinent labs & imaging results that were available during my care of the patient were reviewed by me and considered in my medical decision making (see chart for details).     Urinalysis showing small amount of leukocytes that could indicate urinary tract infection.  Will treat with Macrobid x5 days.  Urine culture is pending.  Cervicovaginal swab also pending.  Highly suspicious that leukocytes could be showing due to bacterial vaginosis as patient has vaginal odor.  Will change treatment or add treatment if appropriate once vaginal swab and urine culture have resulted.  Patient to refrain from sexual activity until test results and treatment are complete.  Discussed strict return precautions.  Patient verbalized understanding and was agreeable with plan. Final Clinical Impressions(s) / UC Diagnoses   Final diagnoses:  Vaginal odor  Screening examination for venereal disease  Lower urinary tract infection     Discharge Instructions      Your urine is showing signs of possible urinary tract infection.  This is being treated with Macrobid antibiotic.  Urine culture and vaginal swab is pending.  We will call if any abnormalities and treat as appropriate.  Please refrain from sexual activity until treatment and test results are complete.    ED Prescriptions     Medication Sig Dispense Auth. Provider   nitrofurantoin, macrocrystal-monohydrate, (MACROBID) 100 MG capsule Take 1 capsule (100 mg total) by mouth 2 (two) times daily. 10 capsule  Gustavus Bryant, [place]      PDMP not reviewed this encounter.   Gustavus Bryant, [place] 04/03/21 1343

## 2021-04-03 NOTE — ED Triage Notes (Signed)
Pt st she thinks she has bv she st she noted an odor starting yesterday. No other symptoms noted

## 2021-04-03 NOTE — Discharge Instructions (Signed)
Your urine is showing signs of possible urinary tract infection.  This is being treated with Macrobid antibiotic.  Urine culture and vaginal swab is pending.  We will call if any abnormalities and treat as appropriate.  Please refrain from sexual activity until treatment and test results are complete.

## 2021-04-06 LAB — CERVICOVAGINAL ANCILLARY ONLY
Bacterial Vaginitis (gardnerella): POSITIVE — AB
Candida Glabrata: NEGATIVE
Candida Vaginitis: NEGATIVE
Chlamydia: NEGATIVE
Comment: NEGATIVE
Comment: NEGATIVE
Comment: NEGATIVE
Comment: NEGATIVE
Comment: NEGATIVE
Comment: NORMAL
Neisseria Gonorrhea: NEGATIVE
Trichomonas: NEGATIVE

## 2021-04-07 ENCOUNTER — Telehealth: Payer: Self-pay

## 2021-04-07 MED ORDER — METRONIDAZOLE 500 MG PO TABS: 500.0000 mg | ORAL_TABLET | Freq: Two times a day (BID) | ORAL | 0 refills | Status: DC

## 2021-05-06 ENCOUNTER — Other Ambulatory Visit: Payer: Self-pay

## 2021-05-06 ENCOUNTER — Ambulatory Visit
Admission: RE | Admit: 2021-05-06 | Discharge: 2021-05-06 | Disposition: A | Discharge status: Home / Self Care | Payer: Medicaid Other | Source: Ambulatory Visit | Attending: Physician Assistant | Admitting: Physician Assistant

## 2021-05-06 VITALS — BP 122/76 | HR 84 | Temp 98.1°F | Resp 18

## 2021-05-06 DIAGNOSIS — Z113 Encounter for screening for infections with a predominantly sexual mode of transmission: Secondary | ICD-10-CM | POA: Diagnosis not present

## 2021-05-06 DIAGNOSIS — L0291 Cutaneous abscess, unspecified: Secondary | ICD-10-CM | POA: Insufficient documentation

## 2021-05-06 MED ORDER — DOXYCYCLINE HYCLATE 100 MG PO CAPS: 100.0000 mg | ORAL_CAPSULE | Freq: Two times a day (BID) | ORAL | 0 refills | Status: DC

## 2021-05-06 NOTE — ED Provider Notes (Signed)
[name]    CSN: [id number] Arrival date & time: 05/06/21  0913      History   Chief Complaint Chief Complaint  Patient presents with   9a appt   Insect Bite    Left great digit    HPI Melissa Rowland is a 26 y.o. female.   Patient here today for evaluation of possible insect bite to her left foot. She reports that she first noticed swelling to 1st MTP about 4-5 days ago and despite drainage pustule seems to continue to form. She has not had fever. She does not report treatment for symptoms.   She would also like STD screening. She reports some vaginal discharge but no other symptoms or concerns.   The history is provided by the patient.   History reviewed. No pertinent past medical history.  There are no problems to display for this patient.   History reviewed. No pertinent surgical history.  OB History   No obstetric history on file.      Home Medications    Prior to Admission medications   Medication Sig Start Date End Date Taking? Authorizing Provider  doxycycline (VIBRAMYCIN) 100 MG capsule Take 1 capsule (100 mg total) by mouth 2 (two) times daily. 05/06/21  Yes Tomi Bamberger, PA-C  metroNIDAZOLE (FLAGYL) 500 MG tablet Take 1 tablet (500 mg total) by mouth 2 (two) times daily. 04/07/21   Lamptey, Britta Mccreedy, MD  nitrofurantoin, macrocrystal-monohydrate, (MACROBID) 100 MG capsule Take 1 capsule (100 mg total) by mouth 2 (two) times daily. 04/03/21   Gustavus Bryant, FNP    Family History Family History  Problem Relation Age of Onset   Healthy Mother    Healthy Father     Social History Social History   Tobacco Use   Smoking status: Never   Smokeless tobacco: Never  Vaping Use   Vaping Use: Never used  Substance Use Topics   Alcohol use: Yes    Comment: socially   Drug use: Never     Allergies   Patient has no known allergies.   Review of Systems Review of Systems  Constitutional:  Negative for chills and fever.  Eyes:   Negative for discharge and redness.  Respiratory:  Negative for shortness of breath.   Gastrointestinal:  Negative for abdominal pain, nausea and vomiting.  Genitourinary:  Positive for vaginal discharge. Negative for vaginal bleeding.  Musculoskeletal:  Negative for back pain.  Skin:  Positive for wound. Negative for rash.    Physical Exam Triage Vital Signs ED Triage Vitals  Enc Vitals Group     BP 05/06/21 0933 122/76     Pulse Rate 05/06/21 0933 84     Resp 05/06/21 0933 18     Temp 05/06/21 0933 98.1 F (36.7 C)     Temp Source 05/06/21 0933 Oral     SpO2 05/06/21 0933 98 %     Weight --      Height --      Head Circumference --      Peak Flow --      Pain Score 05/06/21 0935 8     Pain Loc --      Pain Edu? --      Excl. in GC? --    No data found.  Updated Vital Signs BP 122/76 (BP Location: Left Arm)    Pulse 84    Temp 98.1 F (36.7 C) (Oral)    Resp 18    SpO2 98%  Physical Exam Vitals and nursing note reviewed.  Constitutional:      General: She is not in acute distress.    Appearance: Normal appearance. She is not ill-appearing.  HENT:     Head: Normocephalic and atraumatic.  Eyes:     Conjunctiva/sclera: Conjunctivae normal.  Cardiovascular:     Rate and Rhythm: Normal rate.  Pulmonary:     Effort: Pulmonary effort is normal.  Skin:    Comments: ~1 cm pustular appearing area to left 1st MTP. No active bleeding or drainage. Minimal surrounding erythema. Patient is noted to be wearing slide on shoes with rhinestones that may make contact/ friction at 1st MTP   Neurological:     Mental Status: She is alert.  Psychiatric:        Mood and Affect: Mood normal.        Behavior: Behavior normal.        Thought Content: Thought content normal.     UC Treatments / Results  Labs (all labs ordered are listed, but only abnormal results are displayed) Labs Reviewed  CERVICOVAGINAL ANCILLARY ONLY    EKG   Radiology No results  found.  Procedures Procedures (including critical care time)  Medications Ordered in UC Medications - No data to display  Initial Impression / Assessment and Plan / UC Course  I have reviewed the triage vital signs and the nursing notes.  Pertinent labs & imaging results that were available during my care of the patient were reviewed by me and considered in my medical decision making (see chart for details).    Questionable if area of abscess to left foot is due to bite vs friction from shoes and secondary infection- will treat with doxycycline. STD screening ordered. Encouraged follow up with any further concerns or if symptoms do not improve with treatment.   Final Clinical Impressions(s) / UC Diagnoses   Final diagnoses:  Abscess  Screening for STD (sexually transmitted disease)   Discharge Instructions   None    ED Prescriptions     Medication Sig Dispense Auth. Provider   doxycycline (VIBRAMYCIN) 100 MG capsule Take 1 capsule (100 mg total) by mouth 2 (two) times daily. 20 capsule Tomi Bamberger, PA-C      PDMP not reviewed this encounter.   Tomi Bamberger, PA-C 05/06/21 1211

## 2021-05-06 NOTE — ED Triage Notes (Addendum)
4-5 day h/o insect bite of left great toe. Pt is unsure of what insect it could be. She c/o itching and burning at the site. She has busted the lesion twice noting a clear fluid coming out. No ointments or creams applied. Pt also requesting STI testing

## 2021-05-09 LAB — CERVICOVAGINAL ANCILLARY ONLY
Bacterial Vaginitis (gardnerella): NEGATIVE
Candida Glabrata: NEGATIVE
Candida Vaginitis: POSITIVE — AB
Chlamydia: NEGATIVE
Comment: NEGATIVE
Comment: NEGATIVE
Comment: NEGATIVE
Comment: NEGATIVE
Comment: NEGATIVE
Comment: NORMAL
Neisseria Gonorrhea: NEGATIVE
Trichomonas: NEGATIVE

## 2021-05-10 ENCOUNTER — Telehealth (HOSPITAL_COMMUNITY): Payer: Self-pay | Admitting: Emergency Medicine

## 2021-05-10 MED ORDER — FLUCONAZOLE 150 MG PO TABS: 150.0000 mg | ORAL_TABLET | Freq: Once | ORAL | 0 refills | Status: AC

## 2021-05-12 ENCOUNTER — Telehealth (HOSPITAL_COMMUNITY): Payer: Self-pay | Admitting: Emergency Medicine

## 2021-05-12 MED ORDER — FLUCONAZOLE 150 MG PO TABS: 150.0000 mg | ORAL_TABLET | Freq: Once | ORAL | 0 refills | Status: AC

## 2021-05-12 NOTE — Telephone Encounter (Signed)
PAtient called and states prescription was sent to wrong pharmacy, resent to one of request

## 2021-06-13 ENCOUNTER — Other Ambulatory Visit: Payer: Self-pay

## 2021-06-13 ENCOUNTER — Ambulatory Visit
Admission: EM | Admit: 2021-06-13 | Discharge: 2021-06-13 | Disposition: A | Discharge status: Home / Self Care | Payer: Medicaid Other | Attending: Physician Assistant | Admitting: Physician Assistant

## 2021-06-13 DIAGNOSIS — J029 Acute pharyngitis, unspecified: Secondary | ICD-10-CM | POA: Insufficient documentation

## 2021-06-13 LAB — POCT RAPID STREP A (OFFICE): Rapid Strep A Screen: NEGATIVE

## 2021-06-13 MED ORDER — METHYLPREDNISOLONE SODIUM SUCC 125 MG IJ SOLR
80.0000 mg | Freq: Once | INTRAMUSCULAR | Status: AC
  Administered 2021-06-13: 80 mg via INTRAMUSCULAR

## 2021-06-13 NOTE — ED Provider Notes (Signed)
?[name] ? ? ? ?CSN: [id number] ?Arrival date & time: 06/13/21  0806 ? ? ?  ? ?History   ?Chief Complaint ?Chief Complaint  ?Patient presents with  ? Sore Throat  ? ? ?HPI ?Melissa Rowland is a 26 y.o. female.  ? ?Patient here today for evaluation of sore throat, cough and congestion she has had the last few weeks. This has occurred in the past and she has improved with steroid injection. She denies any nausea, vomiting or diarrhea. She has tried taking OTC meds for cough and pain with mild relief.  ? ?The history is provided by the patient.  ?Sore Throat ?Pertinent negatives include no abdominal pain and no shortness of breath.  ? ?History reviewed. No pertinent past medical history. ? ?There are no problems to display for this patient. ? ? ?History reviewed. No pertinent surgical history. ? ?OB History   ?No obstetric history on file. ?  ? ? ? ?Home Medications   ? ?Prior to Admission medications   ?Medication Sig Start Date End Date Taking? Authorizing Provider  ?metroNIDAZOLE (FLAGYL) 500 MG tablet Take 1 tablet (500 mg total) by mouth 2 (two) times daily. 04/07/21   LampteyBritta Mccreedy, MD  ?doxycycline (VIBRAMYCIN) 100 MG capsule Take 1 capsule (100 mg total) by mouth 2 (two) times daily. 05/06/21   Tomi Bamberger, PA-C  ?nitrofurantoin, macrocrystal-monohydrate, (MACROBID) 100 MG capsule Take 1 capsule (100 mg total) by mouth 2 (two) times daily. 04/03/21   Gustavus Bryant, FNP  ? ? ?Family History ?Family History  ?Problem Relation Age of Onset  ? Healthy Mother   ? Healthy Father   ? ? ?Social History ?Social History  ? ?Tobacco Use  ? Smoking status: Never  ? Smokeless tobacco: Never  ?Vaping Use  ? Vaping Use: Never used  ?Substance Use Topics  ? Alcohol use: Yes  ?  Comment: socially  ? Drug use: Never  ? ? ? ?Allergies   ?Patient has no known allergies. ? ? ?Review of Systems ?Review of Systems  ?Constitutional:  Negative for chills and fever.  ?HENT:  Positive for congestion and sore throat.  Negative for ear pain.   ?Eyes:  Negative for discharge and redness.  ?Respiratory:  Positive for cough. Negative for shortness of breath and wheezing.   ?Gastrointestinal:  Negative for abdominal pain, diarrhea, nausea and vomiting.  ? ? ?Physical Exam ?Triage Vital Signs ?ED Triage Vitals [06/13/21 0827]  ?Enc Vitals Group  ?   BP 138/78  ?   Pulse Rate 94  ?   Resp 18  ?   Temp 97.7 ?F (36.5 ?C)  ?   Temp Source Oral  ?   SpO2 94 %  ?   Weight   ?   Height   ?   Head Circumference   ?   Peak Flow   ?   Pain Score 10  ?   Pain Loc   ?   Pain Edu?   ?   Excl. in GC?   ? ?No data found. ? ?Updated Vital Signs ?BP 138/78 (BP Location: Left Arm)   Pulse 94   Temp 97.7 ?F (36.5 ?C) (Oral)   Resp 18   SpO2 94%  ?   ? ?Physical Exam ?Vitals and nursing note reviewed.  ?Constitutional:   ?   General: She is not in acute distress. ?   Appearance: Normal appearance. She is not ill-appearing.  ?HENT:  ?   Head:  Normocephalic and atraumatic.  ?   Nose: Congestion present.  ?   Mouth/Throat:  ?   Mouth: Mucous membranes are moist.  ?   Pharynx: Posterior oropharyngeal erythema present. No oropharyngeal exudate.  ?Eyes:  ?   Conjunctiva/sclera: Conjunctivae normal.  ?Cardiovascular:  ?   Rate and Rhythm: Normal rate and regular rhythm.  ?   Heart sounds: Normal heart sounds. No murmur heard. ?Pulmonary:  ?   Effort: Pulmonary effort is normal. No respiratory distress.  ?   Breath sounds: Normal breath sounds. No wheezing, rhonchi or rales.  ?Skin: ?   General: Skin is warm and dry.  ?Neurological:  ?   Mental Status: She is alert.  ?Psychiatric:     ?   Mood and Affect: Mood normal.     ?   Thought Content: Thought content normal.  ? ? ? ?UC Treatments / Results  ?Labs ?(all labs ordered are listed, but only abnormal results are displayed) ?Labs Reviewed  ?CULTURE, GROUP A STREP [facility])  ?POCT RAPID STREP A (OFFICE)  ? ? ?EKG ? ? ?Radiology ?No results found. ? ?Procedures ?Procedures (including critical care  time) ? ?Medications Ordered in UC ?Medications  ?methylPREDNISolone sodium succinate (SOLU-MEDROL) 125 mg/2 mL injection 80 mg (80 mg Intramuscular Given 06/13/21 0914)  ? ? ?Initial Impression / Assessment and Plan / UC Course  ?I have reviewed the triage vital signs and the nursing notes. ? ?Pertinent labs & imaging results that were available during my care of the patient were reviewed by me and considered in my medical decision making (see chart for details). ? ? Will treat with steroid injection as this has helped patient in the past-- suspect likely allergic cause of symptoms. Strep test negative, will order throat culture. Encourage follow up with any concerns.  ? ?Final Clinical Impressions(s) / UC Diagnoses  ? ?Final diagnoses:  ?Acute pharyngitis, unspecified etiology  ? ?Discharge Instructions   ?None ?  ? ?ED Prescriptions   ?None ?  ? ?PDMP not reviewed this encounter. ?  ?Tomi Bamberger, PA-C ?06/13/21 0932 ? ?

## 2021-06-13 NOTE — ED Triage Notes (Signed)
Pt reports a few weeks of sore throat, cough, congestion and runny nose. Confirms pain w/swallowing. Has been taking otc cough and pain meds. No v/d.  ?

## 2021-06-15 LAB — CULTURE, GROUP A STREP (THRC)

## 2021-06-27 ENCOUNTER — Ambulatory Visit
Admission: RE | Admit: 2021-06-27 | Discharge: 2021-06-27 | Disposition: A | Discharge status: Home / Self Care | Payer: Medicaid Other | Source: Ambulatory Visit | Attending: Physician Assistant | Admitting: Physician Assistant

## 2021-06-27 ENCOUNTER — Ambulatory Visit: Payer: Self-pay

## 2021-06-27 VITALS — BP 152/76 | HR 87 | Temp 98.0°F | Resp 17

## 2021-06-27 DIAGNOSIS — N898 Other specified noninflammatory disorders of vagina: Secondary | ICD-10-CM | POA: Diagnosis not present

## 2021-06-27 LAB — POCT URINE PREGNANCY: Preg Test, Ur: NEGATIVE

## 2021-06-27 MED ORDER — METRONIDAZOLE 500 MG PO TABS: 500.0000 mg | ORAL_TABLET | Freq: Two times a day (BID) | ORAL | 0 refills | Status: DC

## 2021-06-27 NOTE — ED Triage Notes (Signed)
Onset yesterday of vaginal discharge and odor. Pt is suspicious for BV. ?

## 2021-06-27 NOTE — ED Provider Notes (Signed)
?[name] ? ? ? ?CSN: [id number] ?Arrival date & time: 06/27/21  1604 ? ? ?  ? ?History   ?Chief Complaint ?Chief Complaint  ?Patient presents with  ? Vaginal Discharge  ?  I believe I have BV but I want to be checked for everything. - Entered by patient  ? ? ?HPI ?Melissa Rowland is a 26 y.o. female.  ? ?Patient here today for evaluation of vaginal discharge with associated odor that she noticed yesterday. She does have history of BV and is concerned for same. She denies any known STD exposure but would like screening for same. She denies any other symptoms. She declines blood work today.  ? ?The history is provided by the patient.  ?Vaginal Discharge ?Associated symptoms: no abdominal pain, no fever, no nausea and no vomiting   ? ?History reviewed. No pertinent past medical history. ? ?There are no problems to display for this patient. ? ? ?History reviewed. No pertinent surgical history. ? ?OB History   ?No obstetric history on file. ?  ? ? ? ?Home Medications   ? ?Prior to Admission medications   ?Medication Sig Start Date End Date Taking? Authorizing Provider  ?metroNIDAZOLE (FLAGYL) 500 MG tablet Take 1 tablet (500 mg total) by mouth 2 (two) times daily. 06/27/21  Yes Tomi Bamberger, PA-C  ?doxycycline (VIBRAMYCIN) 100 MG capsule Take 1 capsule (100 mg total) by mouth 2 (two) times daily. 05/06/21   Tomi Bamberger, PA-C  ?nitrofurantoin, macrocrystal-monohydrate, (MACROBID) 100 MG capsule Take 1 capsule (100 mg total) by mouth 2 (two) times daily. 04/03/21   Gustavus Bryant, FNP  ? ? ?Family History ?Family History  ?Problem Relation Age of Onset  ? Healthy Mother   ? Healthy Father   ? ? ?Social History ?Social History  ? ?Tobacco Use  ? Smoking status: Never  ? Smokeless tobacco: Never  ?Vaping Use  ? Vaping Use: Never used  ?Substance Use Topics  ? Alcohol use: Yes  ?  Comment: socially  ? Drug use: Never  ? ? ? ?Allergies   ?Patient has no known allergies. ? ? ?Review of Systems ?Review of  Systems  ?Constitutional:  Negative for chills and fever.  ?Eyes:  Negative for discharge and redness.  ?Gastrointestinal:  Negative for abdominal pain, nausea and vomiting.  ?Genitourinary:  Positive for vaginal discharge.  ?Musculoskeletal:  Negative for back pain.  ? ? ?Physical Exam ?Triage Vital Signs ?ED Triage Vitals  ?Enc Vitals Group  ?   BP 06/27/21 1630 (!) 152/76  ?   Pulse Rate 06/27/21 1630 87  ?   Resp 06/27/21 1630 17  ?   Temp 06/27/21 1630 98 ?F (36.7 ?C)  ?   Temp Source 06/27/21 1630 Oral  ?   SpO2 06/27/21 1630 98 %  ?   Weight --   ?   Height --   ?   Head Circumference --   ?   Peak Flow --   ?   Pain Score 06/27/21 1631 0  ?   Pain Loc --   ?   Pain Edu? --   ?   Excl. in GC? --   ? ?No data found. ? ?Updated Vital Signs ?BP (!) 152/76 (BP Location: Left Arm)   Pulse 87   Temp 98 ?F (36.7 ?C) (Oral)   Resp 17   SpO2 98%  ? ?Physical Exam ?Vitals and nursing note reviewed.  ?Constitutional:   ?   General: She  is not in acute distress. ?   Appearance: Normal appearance. She is not ill-appearing.  ?HENT:  ?   Head: Normocephalic and atraumatic.  ?Eyes:  ?   Conjunctiva/sclera: Conjunctivae normal.  ?Cardiovascular:  ?   Rate and Rhythm: Normal rate.  ?Pulmonary:  ?   Effort: Pulmonary effort is normal.  ?Neurological:  ?   Mental Status: She is alert.  ?Psychiatric:     ?   Mood and Affect: Mood normal.     ?   Behavior: Behavior normal.     ?   Thought Content: Thought content normal.  ? ? ? ?UC Treatments / Results  ?Labs ?(all labs ordered are listed, but only abnormal results are displayed) ?Labs Reviewed  ?POCT URINE PREGNANCY  ?CERVICOVAGINAL ANCILLARY ONLY  ? ? ?EKG ? ? ?Radiology ?No results found. ? ?Procedures ?Procedures (including critical care time) ? ?Medications Ordered in UC ?Medications - No data to display ? ?Initial Impression / Assessment and Plan / UC Course  ?I have reviewed the triage vital signs and the nursing notes. ? ?Pertinent labs & imaging results that were  available during my care of the patient were reviewed by me and considered in my medical decision making (see chart for details). ? ?  ?Metronidazole prescribed to cover BV. Other screening ordered. Will await results for further recommendation.  ? ?Final Clinical Impressions(s) / UC Diagnoses  ? ?Final diagnoses:  ?Vaginal discharge  ? ?Discharge Instructions   ?None ?  ? ?ED Prescriptions   ? ? Medication Sig Dispense Auth. Provider  ? metroNIDAZOLE (FLAGYL) 500 MG tablet Take 1 tablet (500 mg total) by mouth 2 (two) times daily. 14 tablet Tomi Bamberger, PA-C  ? ?  ? ?PDMP not reviewed this encounter. ?  ?Tomi Bamberger, PA-C ?06/27/21 1728 ? ?

## 2021-06-30 LAB — CERVICOVAGINAL ANCILLARY ONLY
Bacterial Vaginitis (gardnerella): POSITIVE — AB
Candida Glabrata: NEGATIVE
Candida Vaginitis: NEGATIVE
Chlamydia: NEGATIVE
Comment: NEGATIVE
Comment: NEGATIVE
Comment: NEGATIVE
Comment: NEGATIVE
Comment: NEGATIVE
Comment: NORMAL
Neisseria Gonorrhea: NEGATIVE
Trichomonas: NEGATIVE

## 2021-07-17 ENCOUNTER — Emergency Department (HOSPITAL_COMMUNITY)
Admission: EM | Admit: 2021-07-17 | Discharge: 2021-07-18 | Discharge status: Against Medical Advice | Payer: Medicaid Other | Source: Home / Self Care

## 2021-08-05 ENCOUNTER — Ambulatory Visit: Payer: Self-pay

## 2021-08-22 ENCOUNTER — Ambulatory Visit: Payer: Self-pay

## 2021-10-27 ENCOUNTER — Ambulatory Visit
Admission: RE | Admit: 2021-10-27 | Discharge: 2021-10-27 | Disposition: A | Discharge status: Home / Self Care | Payer: Medicaid Other | Source: Ambulatory Visit | Attending: Physician Assistant | Admitting: Physician Assistant

## 2021-10-27 VITALS — BP 128/80 | HR 97 | Temp 98.4°F | Resp 15

## 2021-10-27 DIAGNOSIS — N3001 Acute cystitis with hematuria: Secondary | ICD-10-CM | POA: Diagnosis not present

## 2021-10-27 DIAGNOSIS — Z113 Encounter for screening for infections with a predominantly sexual mode of transmission: Secondary | ICD-10-CM

## 2021-10-27 LAB — POCT URINALYSIS DIP (MANUAL ENTRY)
Bilirubin, UA: NEGATIVE
Glucose, UA: NEGATIVE mg/dL
Ketones, POC UA: NEGATIVE mg/dL
Nitrite, UA: NEGATIVE
Protein Ur, POC: 30 mg/dL — AB
Spec Grav, UA: >=1.03 — AB (ref 1.010–1.025)
Urobilinogen, UA: 1 E.U./dL
pH, UA: 6.5 (ref 5.0–8.0)

## 2021-10-27 LAB — POCT URINE PREGNANCY: Preg Test, Ur: NEGATIVE

## 2021-10-27 MED ORDER — NITROFURANTOIN MONOHYD MACRO 100 MG PO CAPS: 100.0000 mg | ORAL_CAPSULE | Freq: Two times a day (BID) | ORAL | 0 refills | Status: DC

## 2021-10-27 NOTE — ED Provider Notes (Signed)
[name]    CSN: [id number] Arrival date & time: 10/27/21  1250      History   Chief Complaint Chief Complaint  Patient presents with   Abdominal Pain    Entered by patient   Hematuria   Nausea   Emesis   Back Pain    HPI Melissa Rowland is a 26 y.o. female.   Patient here today for evaluation of lower abdominal pain, low back pain and hematuria that started last night.  She reports that she has had urinary frequency but produces only small amount of urine.  She reports she has had some nausea and vomiting as well.  She denies fever.  She would like STD screening as well if possible.  The history is provided by the patient.  Abdominal Pain Associated symptoms: dysuria, hematuria, nausea and vomiting   Associated symptoms: no chills and no fever   Hematuria Associated symptoms include abdominal pain.  Emesis Associated symptoms: abdominal pain   Associated symptoms: no chills and no fever   Back Pain Associated symptoms: abdominal pain and dysuria   Associated symptoms: no fever     History reviewed. No pertinent past medical history.  There are no problems to display for this patient.   History reviewed. No pertinent surgical history.  OB History   No obstetric history on file.      Home Medications    Prior to Admission medications   Medication Sig Start Date End Date Taking? Authorizing Provider  nitrofurantoin, macrocrystal-monohydrate, (MACROBID) 100 MG capsule Take 1 capsule (100 mg total) by mouth 2 (two) times daily. 10/27/21  Yes Tomi Bamberger, PA-C  doxycycline (VIBRAMYCIN) 100 MG capsule Take 1 capsule (100 mg total) by mouth 2 (two) times daily. 05/06/21   Tomi Bamberger, PA-C  metroNIDAZOLE (FLAGYL) 500 MG tablet Take 1 tablet (500 mg total) by mouth 2 (two) times daily. 06/27/21   Tomi Bamberger, PA-C    Family History Family History  Problem Relation Age of Onset   Healthy Mother    Healthy Father     Social  History Social History   Tobacco Use   Smoking status: Never   Smokeless tobacco: Never  Vaping Use   Vaping Use: Never used  Substance Use Topics   Alcohol use: Yes    Comment: socially   Drug use: Never     Allergies   Patient has no known allergies.   Review of Systems Review of Systems  Constitutional:  Negative for chills and fever.  Eyes:  Negative for discharge and redness.  Gastrointestinal:  Positive for abdominal pain, nausea and vomiting.  Genitourinary:  Positive for dysuria, frequency and hematuria. Negative for genital sores.  Musculoskeletal:  Positive for back pain.     Physical Exam Triage Vital Signs ED Triage Vitals  Enc Vitals Group     BP 10/27/21 1300 128/80     Pulse Rate 10/27/21 1300 97     Resp 10/27/21 1300 15     Temp 10/27/21 1300 98.4 F (36.9 C)     Temp src --      SpO2 10/27/21 1300 96 %     Weight --      Height --      Head Circumference --      Peak Flow --      Pain Score 10/27/21 1259 4     Pain Loc --      Pain Edu? --  Excl. in GC? --    No data found.  Updated Vital Signs BP 128/80   Pulse 97   Temp 98.4 F (36.9 C)   Resp 15   SpO2 96%   Physical Exam Vitals and nursing note reviewed.  Constitutional:      General: She is not in acute distress.    Appearance: Normal appearance. She is not ill-appearing.  HENT:     Head: Normocephalic and atraumatic.     Nose: Nose normal.  Cardiovascular:     Rate and Rhythm: Normal rate.  Pulmonary:     Effort: Pulmonary effort is normal. No respiratory distress.     Breath sounds: Normal breath sounds.  Abdominal:     General: Abdomen is flat. Bowel sounds are normal. There is no distension.     Palpations: Abdomen is soft.     Tenderness: There is no abdominal tenderness. There is no right CVA tenderness, left CVA tenderness or guarding.  Skin:    General: Skin is warm and dry.  Neurological:     Mental Status: She is alert.  Psychiatric:        Mood and  Affect: Mood normal.        Thought Content: Thought content normal.      UC Treatments / Results  Labs (all labs ordered are listed, but only abnormal results are displayed) Labs Reviewed  POCT URINALYSIS DIP (MANUAL ENTRY) - Abnormal; Notable for the following components:      Result Value   Clarity, UA hazy (*)    Spec Grav, UA >=1.030 (*)    Blood, UA moderate (*)    Protein Ur, POC =30 (*)    Leukocytes, UA Trace (*)    All other components within normal limits  URINE CULTURE  POCT URINE PREGNANCY  CERVICOVAGINAL ANCILLARY ONLY    EKG   Radiology No results found.  Procedures Procedures (including critical care time)  Medications Ordered in UC Medications - No data to display  Initial Impression / Assessment and Plan / UC Course  I have reviewed the triage vital signs and the nursing notes.  Pertinent labs & imaging results that were available during my care of the patient were reviewed by me and considered in my medical decision making (see chart for details).    UA consistent with UTI.  Macrobid prescribed.  Urine culture ordered.  STD screening ordered as requested.  Encouraged follow-up if symptoms fail to improve or worsen.  Final Clinical Impressions(s) / UC Diagnoses   Final diagnoses:  Acute cystitis with hematuria  Screening for STD (sexually transmitted disease)   Discharge Instructions   None    ED Prescriptions     Medication Sig Dispense Auth. Provider   nitrofurantoin, macrocrystal-monohydrate, (MACROBID) 100 MG capsule Take 1 capsule (100 mg total) by mouth 2 (two) times daily. 10 capsule Tomi Bamberger, PA-C      PDMP not reviewed this encounter.   Tomi Bamberger, PA-C 10/27/21 1318

## 2021-10-27 NOTE — ED Triage Notes (Signed)
Pt presents to uc with co of hematuria and low abd/ back pain since last night, pt also co of nausea and vomiting with these symptoms. Pt reports she went to the er but wait time was too long

## 2021-10-28 LAB — CERVICOVAGINAL ANCILLARY ONLY
Bacterial Vaginitis (gardnerella): POSITIVE — AB
Candida Glabrata: NEGATIVE
Candida Vaginitis: POSITIVE — AB
Chlamydia: NEGATIVE
Comment: NEGATIVE
Comment: NEGATIVE
Comment: NEGATIVE
Comment: NEGATIVE
Comment: NEGATIVE
Comment: NORMAL
Neisseria Gonorrhea: NEGATIVE
Trichomonas: NEGATIVE

## 2021-10-29 LAB — URINE CULTURE: Culture: 80000 — AB

## 2021-10-31 ENCOUNTER — Telehealth (HOSPITAL_COMMUNITY): Payer: Self-pay | Admitting: Emergency Medicine

## 2021-10-31 MED ORDER — METRONIDAZOLE 0.75 % VA GEL: 1.0000 | Freq: Every day | VAGINAL | 0 refills | Status: AC

## 2021-10-31 MED ORDER — FLUCONAZOLE 150 MG PO TABS: 150.0000 mg | ORAL_TABLET | Freq: Once | ORAL | 0 refills | Status: AC

## 2021-11-03 ENCOUNTER — Ambulatory Visit: Payer: Self-pay

## 2021-11-21 ENCOUNTER — Ambulatory Visit
Admission: RE | Admit: 2021-11-21 | Discharge: 2021-11-21 | Disposition: A | Discharge status: Home / Self Care | Payer: Medicaid Other | Source: Ambulatory Visit | Attending: Internal Medicine | Admitting: Internal Medicine

## 2021-11-21 VITALS — BP 123/75 | HR 84 | Temp 97.9°F | Resp 18

## 2021-11-21 DIAGNOSIS — N3001 Acute cystitis with hematuria: Secondary | ICD-10-CM | POA: Diagnosis not present

## 2021-11-21 DIAGNOSIS — R350 Frequency of micturition: Secondary | ICD-10-CM | POA: Insufficient documentation

## 2021-11-21 LAB — POCT URINALYSIS DIP (MANUAL ENTRY)
Bilirubin, UA: NEGATIVE
Glucose, UA: NEGATIVE mg/dL
Ketones, POC UA: NEGATIVE mg/dL
Nitrite, UA: NEGATIVE
Protein Ur, POC: 100 mg/dL — AB
Spec Grav, UA: >=1.03 — AB (ref 1.010–1.025)
Urobilinogen, UA: 0.2 E.U./dL
pH, UA: 6 (ref 5.0–8.0)

## 2021-11-21 LAB — POCT URINE PREGNANCY: Preg Test, Ur: NEGATIVE

## 2021-11-21 MED ORDER — CIPROFLOXACIN HCL 500 MG PO TABS: 500.0000 mg | ORAL_TABLET | Freq: Two times a day (BID) | ORAL | 0 refills | Status: DC

## 2021-11-21 NOTE — ED Provider Notes (Signed)
[name]    CSN: [id number] Arrival date & time: 11/21/21  1259      History   Chief Complaint Chief Complaint  Patient presents with   Urinary Frequency    Entered by patient    HPI Melissa Rowland is a 26 y.o. female.   Patient presents with persistent urinary urgency, dysuria, urinary frequency that has been present for about a month.  Patient was seen on 10/27/2021 in urgent care and prescribed Macrobid antibiotic for urinary tract infection.  She reports minimal improvement in symptoms.  Denies any associated vaginal discharge, hematuria, abdominal pain, back pain, fever, abnormal vaginal bleeding.  She states that she is currently on her menstrual cycle at this time.  Patient also had a vaginal swab that tested positive for bacterial vaginosis and vaginal yeast and was prescribed metronidazole and Diflucan.  She reports that she took all medications as prescribed and completed.  Denies any exposure to STD.  Patient would like repeat vaginal swab today.   Urinary Frequency    History reviewed. No pertinent past medical history.  There are no problems to display for this patient.   History reviewed. No pertinent surgical history.  OB History   No obstetric history on file.      Home Medications    Prior to Admission medications   Medication Sig Start Date End Date Taking? Authorizing Provider  ciprofloxacin (CIPRO) 500 MG tablet Take 1 tablet (500 mg total) by mouth every 12 (twelve) hours. 11/21/21  Yes Idora Brosious, Acie Fredrickson, FNP    Family History Family History  Problem Relation Age of Onset   Healthy Mother    Healthy Father     Social History Social History   Tobacco Use   Smoking status: Never   Smokeless tobacco: Never  Vaping Use   Vaping Use: Never used  Substance Use Topics   Alcohol use: Yes    Comment: socially   Drug use: Never     Allergies   Patient has no known allergies.   Review of Systems Review of Systems Per  HPI  Physical Exam Triage Vital Signs ED Triage Vitals  Enc Vitals Group     BP 11/21/21 1313 123/75     Pulse Rate 11/21/21 1313 84     Resp 11/21/21 1313 18     Temp 11/21/21 1313 97.9 F (36.6 C)     Temp src --      SpO2 11/21/21 1313 98 %     Weight --      Height --      Head Circumference --      Peak Flow --      Pain Score 11/21/21 1314 5     Pain Loc --      Pain Edu? --      Excl. in GC? --    No data found.  Updated Vital Signs BP 123/75   Pulse 84   Temp 97.9 F (36.6 C)   Resp 18   SpO2 98%   Visual Acuity Right Eye Distance:   Left Eye Distance:   Bilateral Distance:    Right Eye Near:   Left Eye Near:    Bilateral Near:     Physical Exam Constitutional:      General: She is not in acute distress.    Appearance: Normal appearance. She is not toxic-appearing or diaphoretic.  HENT:     Head: Normocephalic and atraumatic.  Eyes:     Extraocular Movements:  Extraocular movements intact.     Conjunctiva/sclera: Conjunctivae normal.  Cardiovascular:     Rate and Rhythm: Normal rate and regular rhythm.     Pulses: Normal pulses.     Heart sounds: Normal heart sounds.  Pulmonary:     Effort: Pulmonary effort is normal. No respiratory distress.     Breath sounds: Normal breath sounds.  Abdominal:     General: Bowel sounds are normal. There is no distension.     Palpations: Abdomen is soft.     Tenderness: There is no abdominal tenderness.  Genitourinary:    Comments: Deferred with shared decision-making.  Self swab performed. Neurological:     General: No focal deficit present.     Mental Status: She is alert and oriented to person, place, and time. Mental status is at baseline.  Psychiatric:        Mood and Affect: Mood normal.        Behavior: Behavior normal.        Thought Content: Thought content normal.        Judgment: Judgment normal.      UC Treatments / Results  Labs (all labs ordered are listed, but only abnormal results are  displayed) Labs Reviewed  POCT URINALYSIS DIP (MANUAL ENTRY) - Abnormal; Notable for the following components:      Result Value   Clarity, UA cloudy (*)    Spec Grav, UA >=1.030 (*)    Blood, UA large (*)    Protein Ur, POC =100 (*)    Leukocytes, UA Large (3+) (*)    All other components within normal limits  URINE CULTURE  POCT URINE PREGNANCY  CERVICOVAGINAL ANCILLARY ONLY    EKG   Radiology No results found.  Procedures Procedures (including critical care time)  Medications Ordered in UC Medications - No data to display  Initial Impression / Assessment and Plan / UC Course  I have reviewed the triage vital signs and the nursing notes.  Pertinent labs & imaging results that were available during my care of the patient were reviewed by me and considered in my medical decision making (see chart for details).     Urinalysis showing large amount of white blood cells indicating urinary tract infection.  Will treat with Cipro given Macrobid was not effective.  Urine culture pending again.  Patient requesting cervicovaginal swab so this is also pending.  Patient advised to refrain from strenuous physical activity or exercise while taking Cipro.  No concern for PID given patient's symptoms.  Discussed return precautions.  Patient verbalized understanding and was agreeable with plan. Final Clinical Impressions(s) / UC Diagnoses   Final diagnoses:  Acute cystitis with hematuria  Urinary frequency     Discharge Instructions      It appears that you still have a urinary tract infection so you are being treated with an antibiotic.  Please avoid any strenuous physical activity or exercise while taking this antibiotic.  Urine culture and vaginal swab are pending.  We will call with the result.  Please refrain from sexual activity until test results and treatment are complete.    ED Prescriptions     Medication Sig Dispense Auth. Provider   ciprofloxacin (CIPRO) 500 MG tablet  Take 1 tablet (500 mg total) by mouth every 12 (twelve) hours. 10 tablet Gustavus Bryant, [place]      PDMP not reviewed this encounter.   Gustavus Bryant, [place] 11/21/21 1356

## 2021-11-21 NOTE — ED Triage Notes (Signed)
Pt presents to uc with co of urinary frequency. Pt reports one month ago she was treated for uti but symptoms returned shortly after finishing antibiotics.

## 2021-11-21 NOTE — Discharge Instructions (Signed)
It appears that you still have a urinary tract infection so you are being treated with an antibiotic.  Please avoid any strenuous physical activity or exercise while taking this antibiotic.  Urine culture and vaginal swab are pending.  We will call with the result.  Please refrain from sexual activity until test results and treatment are complete.

## 2021-11-22 LAB — CERVICOVAGINAL ANCILLARY ONLY
Bacterial Vaginitis (gardnerella): POSITIVE — AB
Candida Glabrata: NEGATIVE
Candida Vaginitis: POSITIVE — AB
Chlamydia: NEGATIVE
Comment: NEGATIVE
Comment: NEGATIVE
Comment: NEGATIVE
Comment: NEGATIVE
Comment: NEGATIVE
Comment: NORMAL
Neisseria Gonorrhea: NEGATIVE
Trichomonas: NEGATIVE

## 2021-11-22 LAB — URINE CULTURE

## 2021-11-23 ENCOUNTER — Telehealth (HOSPITAL_COMMUNITY): Payer: Self-pay | Admitting: Emergency Medicine

## 2021-11-23 MED ORDER — METRONIDAZOLE 500 MG PO TABS: 500.0000 mg | ORAL_TABLET | Freq: Two times a day (BID) | ORAL | 0 refills | Status: DC

## 2021-11-23 MED ORDER — FLUCONAZOLE 150 MG PO TABS
150.0000 mg | ORAL_TABLET | Freq: Once | ORAL | 0 refills | Status: AC
Start: 2021-11-23 — End: 2021-11-23

## 2021-12-19 ENCOUNTER — Ambulatory Visit
Admission: RE | Admit: 2021-12-19 | Discharge: 2021-12-19 | Disposition: A | Discharge status: Home / Self Care | Payer: Medicaid Other | Source: Ambulatory Visit | Attending: Internal Medicine | Admitting: Internal Medicine

## 2021-12-19 VITALS — BP 128/84 | HR 82 | Temp 98.0°F | Resp 16

## 2021-12-19 DIAGNOSIS — Z113 Encounter for screening for infections with a predominantly sexual mode of transmission: Secondary | ICD-10-CM | POA: Diagnosis not present

## 2021-12-19 DIAGNOSIS — N898 Other specified noninflammatory disorders of vagina: Secondary | ICD-10-CM | POA: Diagnosis not present

## 2021-12-19 MED ORDER — METRONIDAZOLE 0.75 % EX GEL
1.0000 | Freq: Every day | CUTANEOUS | 0 refills | Status: AC
Start: 2021-12-19 — End: 2021-12-24

## 2021-12-19 NOTE — ED Provider Notes (Signed)
[facility]    CSN: [id number] Arrival date & time: 12/19/21  1600      History   Chief Complaint Chief Complaint  Patient presents with   SEXUALLY TRANSMITTED DISEASE    Weird smell - Entered by patient    HPI Melissa Rowland is a 26 y.o. female.   Patient presents with "creamy" vaginal discharge with fishy vaginal odor that started about 3 days ago.  Patient also reports some mild lower abdominal cramping.  Denies abnormal vaginal bleeding, dysuria, urinary frequency, fever.  Denies any confirmed exposure to STD but patient does have a new sexual partner.  Patient would like STD testing but is declining HIV and syphilis testing.  Patient reports that she is concerned for bacterial vaginosis given that she has a history of recurrent BV.  Patient was last treated a few weeks prior for BV.  Those symptoms resolved, and these are new symptoms.  Patient completed menstrual cycle approximately 1 week ago.     History reviewed. No pertinent past medical history.  There are no problems to display for this patient.   History reviewed. No pertinent surgical history.  OB History   No obstetric history on file.      Home Medications    Prior to Admission medications   Medication Sig Start Date End Date Taking? Authorizing Provider  metroNIDAZOLE (METROGEL) 0.75 % gel Apply 1 Application topically at bedtime for 5 days. 12/19/21 12/24/21 Yes Panayiota Larkin, Michele Rockers, FNP    Family History Family History  Problem Relation Age of Onset   Healthy Mother    Healthy Father     Social History Social History   Tobacco Use   Smoking status: Never   Smokeless tobacco: Never  Vaping Use   Vaping Use: Never used  Substance Use Topics   Alcohol use: Yes    Comment: socially   Drug use: Never     Allergies   Patient has no known allergies.   Review of Systems Review of Systems Per HPI  Physical Exam Triage Vital Signs ED Triage Vitals [12/19/21 1612]  Enc Vitals  Group     BP 128/84     Pulse Rate 82     Resp 16     Temp 98 F (36.7 C)     Temp Source Oral     SpO2 98 %     Weight      Height      Head Circumference      Peak Flow      Pain Score 0     Pain Loc      Pain Edu?      Excl. in [place]?    No data found.  Updated Vital Signs BP 128/84 (BP Location: Left Arm)   Pulse 82   Temp 98 F (36.7 C) (Oral)   Resp 16   SpO2 98%   Visual Acuity Right Eye Distance:   Left Eye Distance:   Bilateral Distance:    Right Eye Near:   Left Eye Near:    Bilateral Near:     Physical Exam Constitutional:      General: She is not in acute distress.    Appearance: Normal appearance. She is not toxic-appearing or diaphoretic.  HENT:     Head: Normocephalic and atraumatic.  Eyes:     Extraocular Movements: Extraocular movements intact.     Conjunctiva/sclera: Conjunctivae normal.  Cardiovascular:     Rate and Rhythm: Normal rate and regular  rhythm.     Pulses: Normal pulses.     Heart sounds: Normal heart sounds.  Pulmonary:     Effort: Pulmonary effort is normal. No respiratory distress.     Breath sounds: Normal breath sounds.  Abdominal:     General: Bowel sounds are normal. There is no distension.     Palpations: Abdomen is soft.     Tenderness: There is no abdominal tenderness.  Genitourinary:    Comments: Deferred with shared decision-making.  Self swab performed. Neurological:     General: No focal deficit present.     Mental Status: She is alert and oriented to person, place, and time. Mental status is at baseline.  Psychiatric:        Mood and Affect: Mood normal.        Behavior: Behavior normal.        Thought Content: Thought content normal.        Judgment: Judgment normal.      UC Treatments / Results  Labs (all labs ordered are listed, but only abnormal results are displayed) Labs Reviewed  CERVICOVAGINAL ANCILLARY ONLY    EKG   Radiology No results found.  Procedures Procedures (including  critical care time)  Medications Ordered in UC Medications - No data to display  Initial Impression / Assessment and Plan / UC Course  I have reviewed the triage vital signs and the nursing notes.  Pertinent labs & imaging results that were available during my care of the patient were reviewed by me and considered in my medical decision making (see chart for details).     Given that patient has recurrent bacterial vaginosis and patient's symptoms are typical for this, will opt to treat with metronidazole.  Patient is requesting metronidazole gel as opposed to oral medication.  This was sent for patient.  Cervicovaginal swab pending.  Will await results for any further treatment.  Patient advised to refrain from sexual activity until test results and treatment are complete.  UA was deferred given patient just completed menstrual cycle.  Urinalysis deferred given no associated urinary symptoms.  Discussed return precautions.  Patient verbalized understanding and was agreeable with plan. Final Clinical Impressions(s) / UC Diagnoses   Final diagnoses:  Vaginal discharge  Screening examination for venereal disease     Discharge Instructions      Vaginal swab is pending.  I have sent you metronidazole gel to help alleviate bacterial vaginosis that is present.  We will call you if vaginal swab result is positive.  Please refrain from sexual activity until test results and treatment are complete.  Please follow-up if symptoms persist or worsen.    ED Prescriptions     Medication Sig Dispense Auth. Provider   metroNIDAZOLE (METROGEL) 0.75 % gel Apply 1 Application topically at bedtime for 5 days. 45 g Gustavus Bryant, [place]      PDMP not reviewed this encounter.   Gustavus Bryant, [place] 12/19/21 [phone]

## 2021-12-19 NOTE — Discharge Instructions (Signed)
Vaginal swab is pending.  I have sent you metronidazole gel to help alleviate bacterial vaginosis that is present.  We will call you if vaginal swab result is positive.  Please refrain from sexual activity until test results and treatment are complete.  Please follow-up if symptoms persist or worsen.

## 2021-12-19 NOTE — ED Triage Notes (Signed)
Pt c/o vaginal discharge and odor, frequency, onset ~ 3 days ago. Denies pain

## 2021-12-20 LAB — CERVICOVAGINAL ANCILLARY ONLY
Bacterial Vaginitis (gardnerella): POSITIVE — AB
Candida Glabrata: NEGATIVE
Candida Vaginitis: NEGATIVE
Chlamydia: NEGATIVE
Comment: NEGATIVE
Comment: NEGATIVE
Comment: NEGATIVE
Comment: NEGATIVE
Comment: NEGATIVE
Comment: NORMAL
Neisseria Gonorrhea: NEGATIVE
Trichomonas: NEGATIVE

## 2022-01-03 ENCOUNTER — Ambulatory Visit
Admission: RE | Admit: 2022-01-03 | Discharge: 2022-01-03 | Disposition: A | Discharge status: Home / Self Care | Payer: Medicaid Other | Source: Ambulatory Visit | Attending: Internal Medicine | Admitting: Internal Medicine

## 2022-01-03 VITALS — BP 120/75 | HR 79 | Temp 98.0°F | Resp 16

## 2022-01-03 DIAGNOSIS — N898 Other specified noninflammatory disorders of vagina: Secondary | ICD-10-CM | POA: Insufficient documentation

## 2022-01-03 DIAGNOSIS — Z113 Encounter for screening for infections with a predominantly sexual mode of transmission: Secondary | ICD-10-CM | POA: Diagnosis not present

## 2022-01-03 MED ORDER — CLINDAMYCIN HCL 150 MG PO CAPS: 300.0000 mg | ORAL_CAPSULE | Freq: Two times a day (BID) | ORAL | 0 refills | Status: AC

## 2022-01-03 NOTE — ED Provider Notes (Signed)
[name]    CSN: [id number] Arrival date & time: 01/03/22  0854      History   Chief Complaint Chief Complaint  Patient presents with   Vaginal Discharge    Signs of bacterial vaginitis - Entered by patient    HPI Melissa Rowland is a 26 y.o. female.   Patient presents with vaginal discharge and vaginal odor that started about 3 days ago.  Patient was previously seen on 12/19/2021 and tested positive for bacterial vaginosis where she was treated with metronidazole gel with resolution of symptoms.  Patient reports history of recurrent bacterial vaginosis.  Since being treated on 12/19/2021, patient denies any new sexual partners or unprotected sexual intercourse.  Last menstrual cycle was 12/12/21.  Patient adamantly declines any chance of pregnancy.  Denies any other associated symptoms including dysuria, urinary frequency, abdominal pain, back pain, abnormal vaginal bleeding, hematuria, pelvic pain, fever.   Vaginal Discharge   History reviewed. No pertinent past medical history.  There are no problems to display for this patient.   History reviewed. No pertinent surgical history.  OB History   No obstetric history on file.      Home Medications    Prior to Admission medications   Medication Sig Start Date End Date Taking? Authorizing Provider  clindamycin (CLEOCIN) 150 MG capsule Take 2 capsules (300 mg total) by mouth in the morning and at bedtime for 7 days. 01/03/22 01/10/22 Yes Heavin Sebree, Michele Rockers, FNP    Family History Family History  Problem Relation Age of Onset   Healthy Mother    Healthy Father     Social History Social History   Tobacco Use   Smoking status: Never   Smokeless tobacco: Never  Vaping Use   Vaping Use: Never used  Substance Use Topics   Alcohol use: Yes    Comment: socially   Drug use: Never     Allergies   Patient has no known allergies.   Review of Systems Review of Systems Per HPI  Physical Exam Triage  Vital Signs ED Triage Vitals [01/03/22 0914]  Enc Vitals Group     BP 120/75     Pulse Rate 79     Resp 16     Temp 98 F (36.7 C)     Temp src      SpO2 98 %     Weight      Height      Head Circumference      Peak Flow      Pain Score 0     Pain Loc      Pain Edu?      Excl. in [place]?    No data found.  Updated Vital Signs BP 120/75   Pulse 79   Temp 98 F (36.7 C)   Resp 16   SpO2 98%   Visual Acuity Right Eye Distance:   Left Eye Distance:   Bilateral Distance:    Right Eye Near:   Left Eye Near:    Bilateral Near:     Physical Exam Constitutional:      General: She is not in acute distress.    Appearance: Normal appearance. She is not toxic-appearing or diaphoretic.  HENT:     Head: Normocephalic and atraumatic.  Eyes:     Extraocular Movements: Extraocular movements intact.     Conjunctiva/sclera: Conjunctivae normal.  Cardiovascular:     Rate and Rhythm: Normal rate and regular rhythm.     Pulses: Normal  pulses.     Heart sounds: Normal heart sounds.  Pulmonary:     Effort: Pulmonary effort is normal. No respiratory distress.     Breath sounds: Normal breath sounds.  Abdominal:     General: Bowel sounds are normal. There is no distension.     Palpations: Abdomen is soft.     Tenderness: There is no abdominal tenderness.  Genitourinary:    Comments: Deferred with shared decision making.  Self swab performed. Neurological:     General: No focal deficit present.     Mental Status: She is alert and oriented to person, place, and time. Mental status is at baseline.  Psychiatric:        Mood and Affect: Mood normal.        Behavior: Behavior normal.        Thought Content: Thought content normal.        Judgment: Judgment normal.      UC Treatments / Results  Labs (all labs ordered are listed, but only abnormal results are displayed) Labs Reviewed  CERVICOVAGINAL ANCILLARY ONLY    EKG   Radiology No results  found.  Procedures Procedures (including critical care time)  Medications Ordered in UC Medications - No data to display  Initial Impression / Assessment and Plan / UC Course  I have reviewed the triage vital signs and the nursing notes.  Pertinent labs & imaging results that were available during my care of the patient were reviewed by me and considered in my medical decision making (see chart for details).     Highly suspicious of recurrent bacterial vaginosis.  Patient reports these are new symptoms as previous symptoms have resolved.  There is no concern for PID given patient's report of symptoms so pelvic exam was deferred.  Given patient has frequent recurrent bacterial vaginosis, I do think that patient would benefit from clindamycin so this has been prescribed to help treat this.  Also advised patient that it be best to follow-up with gynecology for further evaluation and management given history of recurrent bacterial vaginosis given that she has never seen a gynecologist before.  Patient was given strict return and ER precautions.  Cervicovaginal swab pending.  Will await results for any further treatment.  Urine pregnancy was deferred given patient denies chance of pregnancy.  UA was also deferred given no associated urinary symptoms.  Patient verbalized understanding and was agreeable with plan. Final Clinical Impressions(s) / UC Diagnoses   Final diagnoses:  Vaginal discharge  Screening examination for venereal disease     Discharge Instructions      You are being prescribed clindamycin given recurrent bacterial vaginosis.  I also highly recommend that you follow-up with this provided contact information for gynecology to schedule appointment for further evaluation and management given that you have recurrent bacterial vaginosis.    ED Prescriptions     Medication Sig Dispense Auth. Provider   clindamycin (CLEOCIN) 150 MG capsule Take 2 capsules (300 mg total) by mouth  in the morning and at bedtime for 7 days. 28 capsule [place], Michele Rockers, [place]      PDMP not reviewed this encounter.   Teodora Medici, [place] 01/03/22 [phone]

## 2022-01-03 NOTE — Discharge Instructions (Signed)
You are being prescribed clindamycin given recurrent bacterial vaginosis.  I also highly recommend that you follow-up with this provided contact information for gynecology to schedule appointment for further evaluation and management given that you have recurrent bacterial vaginosis.

## 2022-01-03 NOTE — ED Triage Notes (Signed)
Pt is present today with c/o vaginal discharge. Pt sx started x3 days ago

## 2022-01-04 LAB — CERVICOVAGINAL ANCILLARY ONLY
Bacterial Vaginitis (gardnerella): POSITIVE — AB
Candida Glabrata: NEGATIVE
Candida Vaginitis: NEGATIVE
Chlamydia: NEGATIVE
Comment: NEGATIVE
Comment: NEGATIVE
Comment: NEGATIVE
Comment: NEGATIVE
Comment: NEGATIVE
Comment: NORMAL
Neisseria Gonorrhea: NEGATIVE
Trichomonas: NEGATIVE

## 2022-01-19 ENCOUNTER — Ambulatory Visit
Admission: EM | Admit: 2022-01-19 | Discharge: 2022-01-19 | Disposition: A | Discharge status: Home / Self Care | Payer: Medicaid Other | Attending: Physician Assistant | Admitting: Physician Assistant

## 2022-01-19 ENCOUNTER — Other Ambulatory Visit: Payer: Self-pay

## 2022-01-19 ENCOUNTER — Encounter: Payer: Self-pay | Admitting: Emergency Medicine

## 2022-01-19 DIAGNOSIS — J029 Acute pharyngitis, unspecified: Secondary | ICD-10-CM | POA: Insufficient documentation

## 2022-01-19 DIAGNOSIS — Z1152 Encounter for screening for COVID-19: Secondary | ICD-10-CM | POA: Insufficient documentation

## 2022-01-19 DIAGNOSIS — R52 Pain, unspecified: Secondary | ICD-10-CM | POA: Diagnosis not present

## 2022-01-19 LAB — POCT RAPID STREP A (OFFICE): Rapid Strep A Screen: NEGATIVE

## 2022-01-19 LAB — RESP PANEL BY RT-PCR (FLU A&B, COVID) ARPGX2
Influenza A by PCR: NEGATIVE
Influenza B by PCR: NEGATIVE
SARS Coronavirus 2 by RT PCR: NEGATIVE

## 2022-01-19 NOTE — ED Provider Notes (Signed)
[facility]    CSN: [id number] Arrival date & time: 01/19/22  0809      History   Chief Complaint Chief Complaint  Patient presents with   Generalized Body Aches   Sore Throat    HPI Melissa Rowland is a 26 y.o. female.   Patient here today for evaluation of sore throat, generalized body aches that started 4 days ago. She has not had cough. She notes her daughter was recently exposed to someone with covid. She has not had fever. She has tried theraflu without relief. She notes her sore throat is her worst symptom.   The history is provided by the patient.  Sore Throat Pertinent negatives include no abdominal pain and no shortness of breath.    History reviewed. No pertinent past medical history.  There are no problems to display for this patient.   History reviewed. No pertinent surgical history.  OB History   No obstetric history on file.      Home Medications    Prior to Admission medications   Not on File    Family History Family History  Problem Relation Age of Onset   Healthy Mother    Healthy Father     Social History Social History   Tobacco Use   Smoking status: Never   Smokeless tobacco: Never  Vaping Use   Vaping Use: Never used  Substance Use Topics   Alcohol use: Yes    Comment: socially   Drug use: Never     Allergies   Patient has no known allergies.   Review of Systems Review of Systems  Constitutional:  Negative for chills and fever.  HENT:  Positive for congestion and sore throat. Negative for ear pain.   Eyes:  Negative for discharge and redness.  Respiratory:  Negative for cough, shortness of breath and wheezing.   Gastrointestinal:  Positive for vomiting. Negative for abdominal pain, diarrhea and nausea.  Musculoskeletal:  Positive for myalgias.     Physical Exam Triage Vital Signs ED Triage Vitals  Enc Vitals Group     BP 01/19/22 0927 124/81     Pulse Rate 01/19/22 0927 (!) 101     Resp  01/19/22 0927 18     Temp 01/19/22 0927 99.3 F (37.4 C)     Temp Source 01/19/22 0927 Oral     SpO2 01/19/22 0927 95 %     Weight --      Height --      Head Circumference --      Peak Flow --      Pain Score 01/19/22 0928 4     Pain Loc --      Pain Edu? --      Excl. in [place]? --    No data found.  Updated Vital Signs BP 124/81 (BP Location: Left Arm)   Pulse (!) 101   Temp 99.3 F (37.4 C) (Oral)   Resp 18   SpO2 95%     Physical Exam Vitals and nursing note reviewed.  Constitutional:      General: She is not in acute distress.    Appearance: Normal appearance. She is not ill-appearing.  HENT:     Head: Normocephalic and atraumatic.     Nose: Congestion present. No rhinorrhea.     Mouth/Throat:     Mouth: Mucous membranes are moist.     Pharynx: Posterior oropharyngeal erythema present.  Eyes:     Conjunctiva/sclera: Conjunctivae normal.  Cardiovascular:  Rate and Rhythm: Normal rate and regular rhythm.  Pulmonary:     Effort: Pulmonary effort is normal. No respiratory distress.     Breath sounds: Normal breath sounds. No wheezing, rhonchi or rales.  Neurological:     Mental Status: She is alert.  Psychiatric:        Mood and Affect: Mood normal.        Behavior: Behavior normal.        Thought Content: Thought content normal.      UC Treatments / Results  Labs (all labs ordered are listed, but only abnormal results are displayed) Labs Reviewed  RESP PANEL BY RT-PCR (FLU A&B, COVID) [id number]  POCT RAPID STREP A (OFFICE)    EKG   Radiology No results found.  Procedures Procedures (including critical care time)  Medications Ordered in UC Medications - No data to display  Initial Impression / Assessment and Plan / UC Course  I have reviewed the triage vital signs and the nursing notes.  Pertinent labs & imaging results that were available during my care of the patient were reviewed by me and considered in my medical decision making (see chart  for details).    Rapid strep test negative in office. Will order covid and flu screening Will await results for further recommendation. Encourage symptomatic treatment in the meantime. Recommend follow up with any further concerns.   Final Clinical Impressions(s) / UC Diagnoses   Final diagnoses:  Acute pharyngitis, unspecified etiology  Body aches  Encounter for screening for COVID-19     Discharge Instructions       Strep test negative. Please check MyChart for Covid and Flu screening results.      ED Prescriptions   None    PDMP not reviewed this encounter.   Tomi Bamberger, PA-C 01/19/22 1014

## 2022-01-19 NOTE — ED Triage Notes (Signed)
Pt here for sore throat and generalized body aches x 4 days with covid exposure

## 2022-01-19 NOTE — Discharge Instructions (Signed)
  Strep test negative. Please check MyChart for Covid and Flu screening results.

## 2022-03-16 LAB — OB RESULTS CONSOLE ANTIBODY SCREEN: Antibody Screen: POSITIVE

## 2022-03-16 LAB — OB RESULTS CONSOLE RUBELLA ANTIBODY, IGM: Rubella: IMMUNE

## 2022-03-16 LAB — OB RESULTS CONSOLE GC/CHLAMYDIA
Chlamydia: NEGATIVE
Neisseria Gonorrhea: NEGATIVE

## 2022-03-16 LAB — OB RESULTS CONSOLE ABO/RH: RH Type: POSITIVE

## 2022-03-16 LAB — HEPATITIS C ANTIBODY: HCV Ab: NEGATIVE

## 2022-03-16 LAB — OB RESULTS CONSOLE RPR: RPR: NONREACTIVE

## 2022-03-16 LAB — OB RESULTS CONSOLE HIV ANTIBODY (ROUTINE TESTING): HIV: NONREACTIVE

## 2022-06-27 ENCOUNTER — Ambulatory Visit: Payer: Self-pay

## 2022-06-27 ENCOUNTER — Ambulatory Visit
Admission: RE | Admit: 2022-06-27 | Discharge: 2022-06-27 | Disposition: A | Discharge status: Home / Self Care | Payer: Medicaid Other | Source: Ambulatory Visit | Attending: Internal Medicine | Admitting: Internal Medicine

## 2022-06-27 ENCOUNTER — Other Ambulatory Visit: Payer: Self-pay

## 2022-06-27 VITALS — BP 119/65 | HR 102 | Temp 97.6°F | Resp 18

## 2022-06-27 DIAGNOSIS — R300 Dysuria: Secondary | ICD-10-CM | POA: Insufficient documentation

## 2022-06-27 DIAGNOSIS — N898 Other specified noninflammatory disorders of vagina: Secondary | ICD-10-CM | POA: Insufficient documentation

## 2022-06-27 LAB — POCT URINALYSIS DIP (MANUAL ENTRY)
Bilirubin, UA: NEGATIVE
Blood, UA: NEGATIVE
Glucose, UA: NEGATIVE mg/dL
Ketones, POC UA: NEGATIVE mg/dL
Leukocytes, UA: NEGATIVE
Nitrite, UA: NEGATIVE
Spec Grav, UA: >=1.03 — AB (ref 1.010–1.025)
Urobilinogen, UA: 0.2 E.U./dL
pH, UA: 7 (ref 5.0–8.0)

## 2022-06-27 NOTE — Discharge Instructions (Signed)
Urine did not show signs of urinary tract infection.  Vaginal swab is pending.  Will call if it is abnormal and treat as appropriate.  Please notify your OB/GYN that you are having the symptoms as well.

## 2022-06-27 NOTE — ED Provider Notes (Signed)
[name]    CSN: [id number] Arrival date & time: 06/27/22  1102      History   Chief Complaint Chief Complaint  Patient presents with   Vaginal Itching    Entered by patient    HPI Melissa Rowland is a 27 y.o. female.   Patient presents with vaginal itching and minimal burning with urination that started about 4 to 5 days ago.  Patient reports that she does have some clear vaginal discharge but was attributing this to pregnancy symptoms as she is currently 6 months pregnant.  Denies urinary frequency, abdominal pain, abnormal vaginal bleeding, back pain that is different from baseline, fever.  Patient reports that she did have 1 unprotected sexual encounter but this was after symptoms started.  Denies exposure to STD.  She is currently being followed by OB/GYN for pregnancy.   Vaginal Itching    History reviewed. No pertinent past medical history.  There are no problems to display for this patient.   History reviewed. No pertinent surgical history.  OB History     Gravida  1   Para      Term      Preterm      AB      Living         SAB      IAB      Ectopic      Multiple      Live Births               Home Medications    Prior to Admission medications   Not on File    Family History Family History  Problem Relation Age of Onset   Healthy Mother    Healthy Father     Social History Social History   Tobacco Use   Smoking status: Never   Smokeless tobacco: Never  Vaping Use   Vaping Use: Never used  Substance Use Topics   Alcohol use: Yes    Comment: socially   Drug use: Never     Allergies   Patient has no known allergies.   Review of Systems Review of Systems Per HPI  Physical Exam Triage Vital Signs ED Triage Vitals [06/27/22 1114]  Enc Vitals Group     BP 119/65     Pulse Rate (!) 102     Resp 18     Temp 97.6 F (36.4 C)     Temp Source Oral     SpO2 95 %     Weight      Height       Head Circumference      Peak Flow      Pain Score 0     Pain Loc      Pain Edu?      Excl. in [place]?    No data found.  Updated Vital Signs BP 119/65 (BP Location: Right Arm)   Pulse (!) 102   Temp 97.6 F (36.4 C) (Oral)   Resp 18   LMP  (LMP Unknown)   SpO2 95%   Visual Acuity Right Eye Distance:   Left Eye Distance:   Bilateral Distance:    Right Eye Near:   Left Eye Near:    Bilateral Near:     Physical Exam Constitutional:      General: She is not in acute distress.    Appearance: Normal appearance. She is not toxic-appearing or diaphoretic.  HENT:     Head: Normocephalic and atraumatic.  Eyes:     Extraocular Movements: Extraocular movements intact.     Conjunctiva/sclera: Conjunctivae normal.  Pulmonary:     Effort: Pulmonary effort is normal.  Genitourinary:    Comments: Deferred with shared decision making.  Self swab performed. Neurological:     General: No focal deficit present.     Mental Status: She is alert and oriented to person, place, and time. Mental status is at baseline.  Psychiatric:        Mood and Affect: Mood normal.        Behavior: Behavior normal.        Thought Content: Thought content normal.        Judgment: Judgment normal.      UC Treatments / Results  Labs (all labs ordered are listed, but only abnormal results are displayed) Labs Reviewed  POCT URINALYSIS DIP (MANUAL ENTRY) - Abnormal; Notable for the following components:      Result Value   Spec Grav, UA >=1.030 (*)    Protein Ur, POC trace (*)    All other components within normal limits  CERVICOVAGINAL ANCILLARY ONLY    EKG   Radiology No results found.  Procedures Procedures (including critical care time)  Medications Ordered in UC Medications - No data to display  Initial Impression / Assessment and Plan / UC Course  I have reviewed the triage vital signs and the nursing notes.  Pertinent labs & imaging results that were available during my care of the  patient were reviewed by me and considered in my medical decision making (see chart for details).     UA not definitive for urinary tract infection.  It does show trace protein but this is most likely due to low p.o. intake as opposed to any pregnancy complications.  Patient reporting main symptom is vaginal itching so suspect possible vaginitis.  Cervicovaginal swab pending.  Awaiting results before treatment given the patient is currently pregnant.  Advised patient to notify OB/GYN of current symptoms as well.  Advised strict return precautions.  Heart rate appears baseline so no further workup for this is necessary.  Patient verbalized understanding and was agreeable with plan. Final Clinical Impressions(s) / UC Diagnoses   Final diagnoses:  Dysuria  Vaginal discharge  Vaginal itching     Discharge Instructions      Urine did not show signs of urinary tract infection.  Vaginal swab is pending.  Will call if it is abnormal and treat as appropriate.  Please notify your OB/GYN that you are having the symptoms as well.    ED Prescriptions   None    PDMP not reviewed this encounter.   Teodora Medici, [place] 06/27/22 1134

## 2022-06-27 NOTE — ED Triage Notes (Signed)
Pt here for vaginal itching and some burning with urination; pt is currently 6 months pregnant with pre natal care

## 2022-06-28 LAB — CERVICOVAGINAL ANCILLARY ONLY
Bacterial Vaginitis (gardnerella): NEGATIVE
Candida Glabrata: NEGATIVE
Candida Vaginitis: POSITIVE — AB
Chlamydia: NEGATIVE
Comment: NEGATIVE
Comment: NEGATIVE
Comment: NEGATIVE
Comment: NEGATIVE
Comment: NEGATIVE
Comment: NORMAL
Neisseria Gonorrhea: NEGATIVE
Trichomonas: NEGATIVE

## 2022-06-29 ENCOUNTER — Telehealth (HOSPITAL_COMMUNITY): Payer: Self-pay | Admitting: Emergency Medicine

## 2022-06-29 MED ORDER — CLOTRIMAZOLE 1 % VA CREA: 1.0000 | TOPICAL_CREAM | Freq: Every day | VAGINAL | 0 refills | Status: AC

## 2022-09-06 ENCOUNTER — Ambulatory Visit: Payer: Medicaid Other

## 2022-09-11 LAB — OB RESULTS CONSOLE GBS: GBS: NEGATIVE

## 2022-09-18 ENCOUNTER — Ambulatory Visit: Payer: Medicaid Other | Admitting: Registered"

## 2022-09-25 ENCOUNTER — Telehealth (HOSPITAL_COMMUNITY): Payer: Self-pay | Admitting: *Deleted

## 2022-09-25 ENCOUNTER — Encounter (HOSPITAL_COMMUNITY): Payer: Self-pay | Admitting: *Deleted

## 2022-09-25 NOTE — Telephone Encounter (Signed)
Preadmission screen  

## 2022-09-26 ENCOUNTER — Observation Stay (HOSPITAL_COMMUNITY)
Admission: RE | Admit: 2022-09-26 | Payer: Medicaid Other | Source: Home / Self Care | Admitting: Obstetrics and Gynecology

## 2022-09-26 ENCOUNTER — Observation Stay (HOSPITAL_COMMUNITY): Payer: Medicaid Other
